# Patient Record
Sex: Male | Born: 1935 | Race: White | Hispanic: No | Marital: Married | Smoking: Former smoker
Health system: Southern US, Community
[De-identification: ages and names within clinical notes are randomized; demographics above are authoritative.]

## PROBLEM LIST (undated history)

## (undated) DIAGNOSIS — J302 Other seasonal allergic rhinitis: Secondary | ICD-10-CM

## (undated) DIAGNOSIS — E785 Hyperlipidemia, unspecified: Secondary | ICD-10-CM

## (undated) DIAGNOSIS — I4891 Unspecified atrial fibrillation: Secondary | ICD-10-CM

## (undated) DIAGNOSIS — K219 Gastro-esophageal reflux disease without esophagitis: Secondary | ICD-10-CM

## (undated) DIAGNOSIS — K5909 Other constipation: Secondary | ICD-10-CM

## (undated) DIAGNOSIS — I428 Other cardiomyopathies: Secondary | ICD-10-CM

## (undated) DIAGNOSIS — I10 Essential (primary) hypertension: Secondary | ICD-10-CM

## (undated) DIAGNOSIS — G4733 Obstructive sleep apnea (adult) (pediatric): Secondary | ICD-10-CM

## (undated) HISTORY — DX: Gastro-esophageal reflux disease without esophagitis: K21.9

## (undated) HISTORY — DX: Other constipation: K59.09

## (undated) HISTORY — DX: Obstructive sleep apnea (adult) (pediatric): G47.33

## (undated) HISTORY — DX: Other cardiomyopathies: I42.8

## (undated) HISTORY — PX: HERNIA REPAIR: SHX51

## (undated) HISTORY — PX: PROSTATE SURGERY: SHX751

## (undated) HISTORY — DX: Essential (primary) hypertension: I10

## (undated) HISTORY — DX: Other seasonal allergic rhinitis: J30.2

## (undated) HISTORY — DX: Hyperlipidemia, unspecified: E78.5

## (undated) HISTORY — DX: Unspecified atrial fibrillation: I48.91

---

## 2008-04-11 ENCOUNTER — Ambulatory Visit (HOSPITAL_COMMUNITY): Admission: RE | Admit: 2008-04-11 | Discharge: 2008-04-11 | Payer: Self-pay | Admitting: Cardiovascular Disease

## 2008-05-18 ENCOUNTER — Ambulatory Visit: Payer: Self-pay | Admitting: Internal Medicine

## 2008-05-25 ENCOUNTER — Ambulatory Visit: Payer: Self-pay | Admitting: Cardiovascular Disease

## 2008-05-25 ENCOUNTER — Inpatient Hospital Stay (HOSPITAL_COMMUNITY): Admission: AD | Admit: 2008-05-25 | Discharge: 2008-05-29 | Payer: Self-pay | Admitting: Cardiovascular Disease

## 2008-05-31 ENCOUNTER — Ambulatory Visit: Payer: Self-pay | Admitting: Internal Medicine

## 2008-06-28 ENCOUNTER — Ambulatory Visit: Payer: Self-pay | Admitting: Internal Medicine

## 2008-06-28 LAB — CONVERTED CEMR LAB
Calcium: 9.2 mg/dL (ref 8.4–10.5)
Digitoxin Lvl: 0.7 ng/mL — ABNORMAL LOW (ref 0.8–2.0)
GFR calc Af Amer: 170 mL/min
GFR calc non Af Amer: 140 mL/min
Magnesium: 1.7 mg/dL (ref 1.5–2.5)
Potassium: 3.9 meq/L (ref 3.5–5.1)
Sodium: 139 meq/L (ref 135–145)

## 2008-07-28 ENCOUNTER — Encounter: Payer: Self-pay | Admitting: Internal Medicine

## 2008-08-23 ENCOUNTER — Telehealth (INDEPENDENT_AMBULATORY_CARE_PROVIDER_SITE_OTHER): Payer: Self-pay | Admitting: *Deleted

## 2008-08-27 ENCOUNTER — Telehealth (INDEPENDENT_AMBULATORY_CARE_PROVIDER_SITE_OTHER): Payer: Self-pay | Admitting: *Deleted

## 2008-09-28 ENCOUNTER — Telehealth: Payer: Self-pay | Admitting: Internal Medicine

## 2008-12-12 ENCOUNTER — Ambulatory Visit: Payer: Self-pay | Admitting: Internal Medicine

## 2008-12-12 DIAGNOSIS — K219 Gastro-esophageal reflux disease without esophagitis: Secondary | ICD-10-CM | POA: Insufficient documentation

## 2008-12-12 DIAGNOSIS — I480 Paroxysmal atrial fibrillation: Secondary | ICD-10-CM

## 2008-12-12 DIAGNOSIS — I10 Essential (primary) hypertension: Secondary | ICD-10-CM

## 2008-12-12 DIAGNOSIS — I5032 Chronic diastolic (congestive) heart failure: Secondary | ICD-10-CM

## 2008-12-12 DIAGNOSIS — E119 Type 2 diabetes mellitus without complications: Secondary | ICD-10-CM

## 2008-12-13 LAB — CONVERTED CEMR LAB
CO2: 28 meq/L (ref 19–32)
Calcium: 10.4 mg/dL (ref 8.4–10.5)
Creatinine, Ser: 0.7 mg/dL (ref 0.4–1.5)
GFR calc non Af Amer: 117.15 mL/min (ref >60)
Magnesium: 2 mg/dL (ref 1.5–2.5)

## 2009-06-25 ENCOUNTER — Ambulatory Visit: Payer: Self-pay | Admitting: Internal Medicine

## 2009-06-25 DIAGNOSIS — R42 Dizziness and giddiness: Secondary | ICD-10-CM | POA: Insufficient documentation

## 2010-01-02 ENCOUNTER — Ambulatory Visit: Payer: Self-pay | Admitting: Internal Medicine

## 2010-01-02 DIAGNOSIS — I440 Atrioventricular block, first degree: Secondary | ICD-10-CM | POA: Insufficient documentation

## 2010-01-28 IMAGING — CR DG CHEST 2V
2 series · 2 of 2 positions shown · non-contrast
Comparison: None

CLINICAL DATA: Shortness of breath.  Precardiac catheterization.

CHEST - 2 VIEW

[view not recorded (1 of 2)]
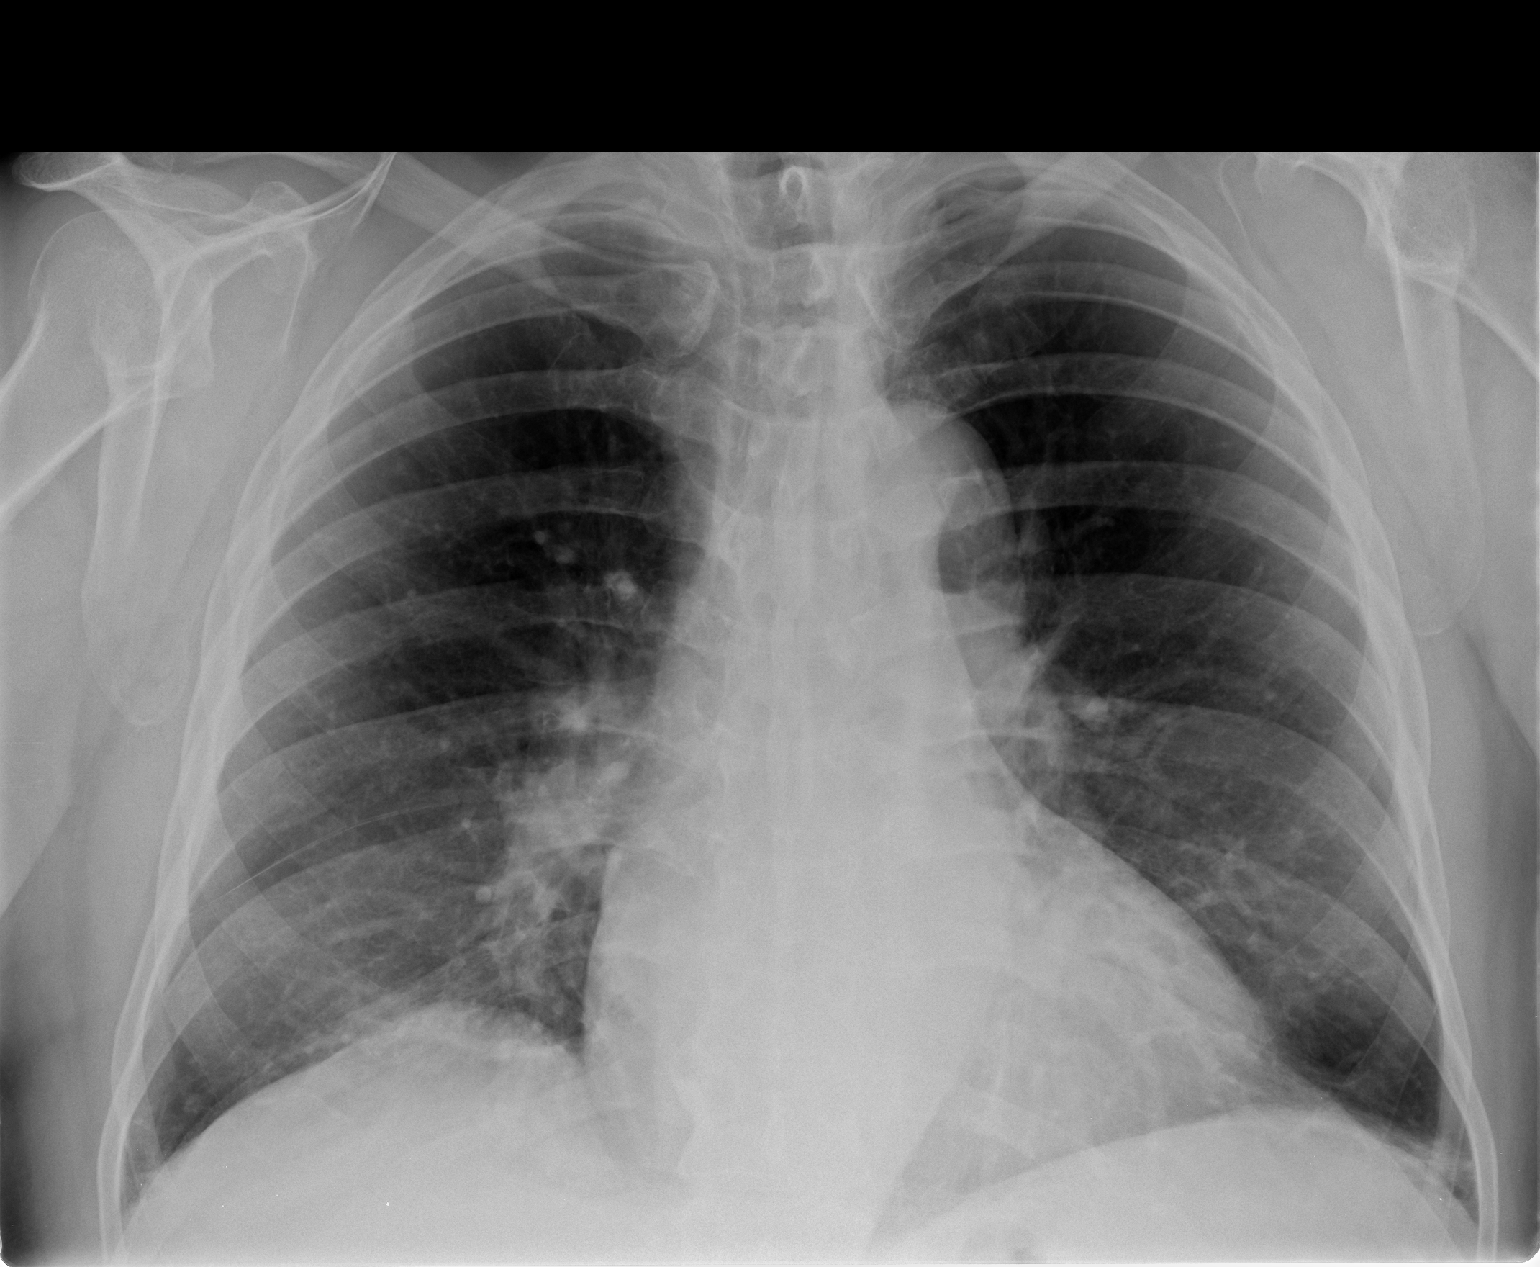

[view not recorded (2 of 2)]
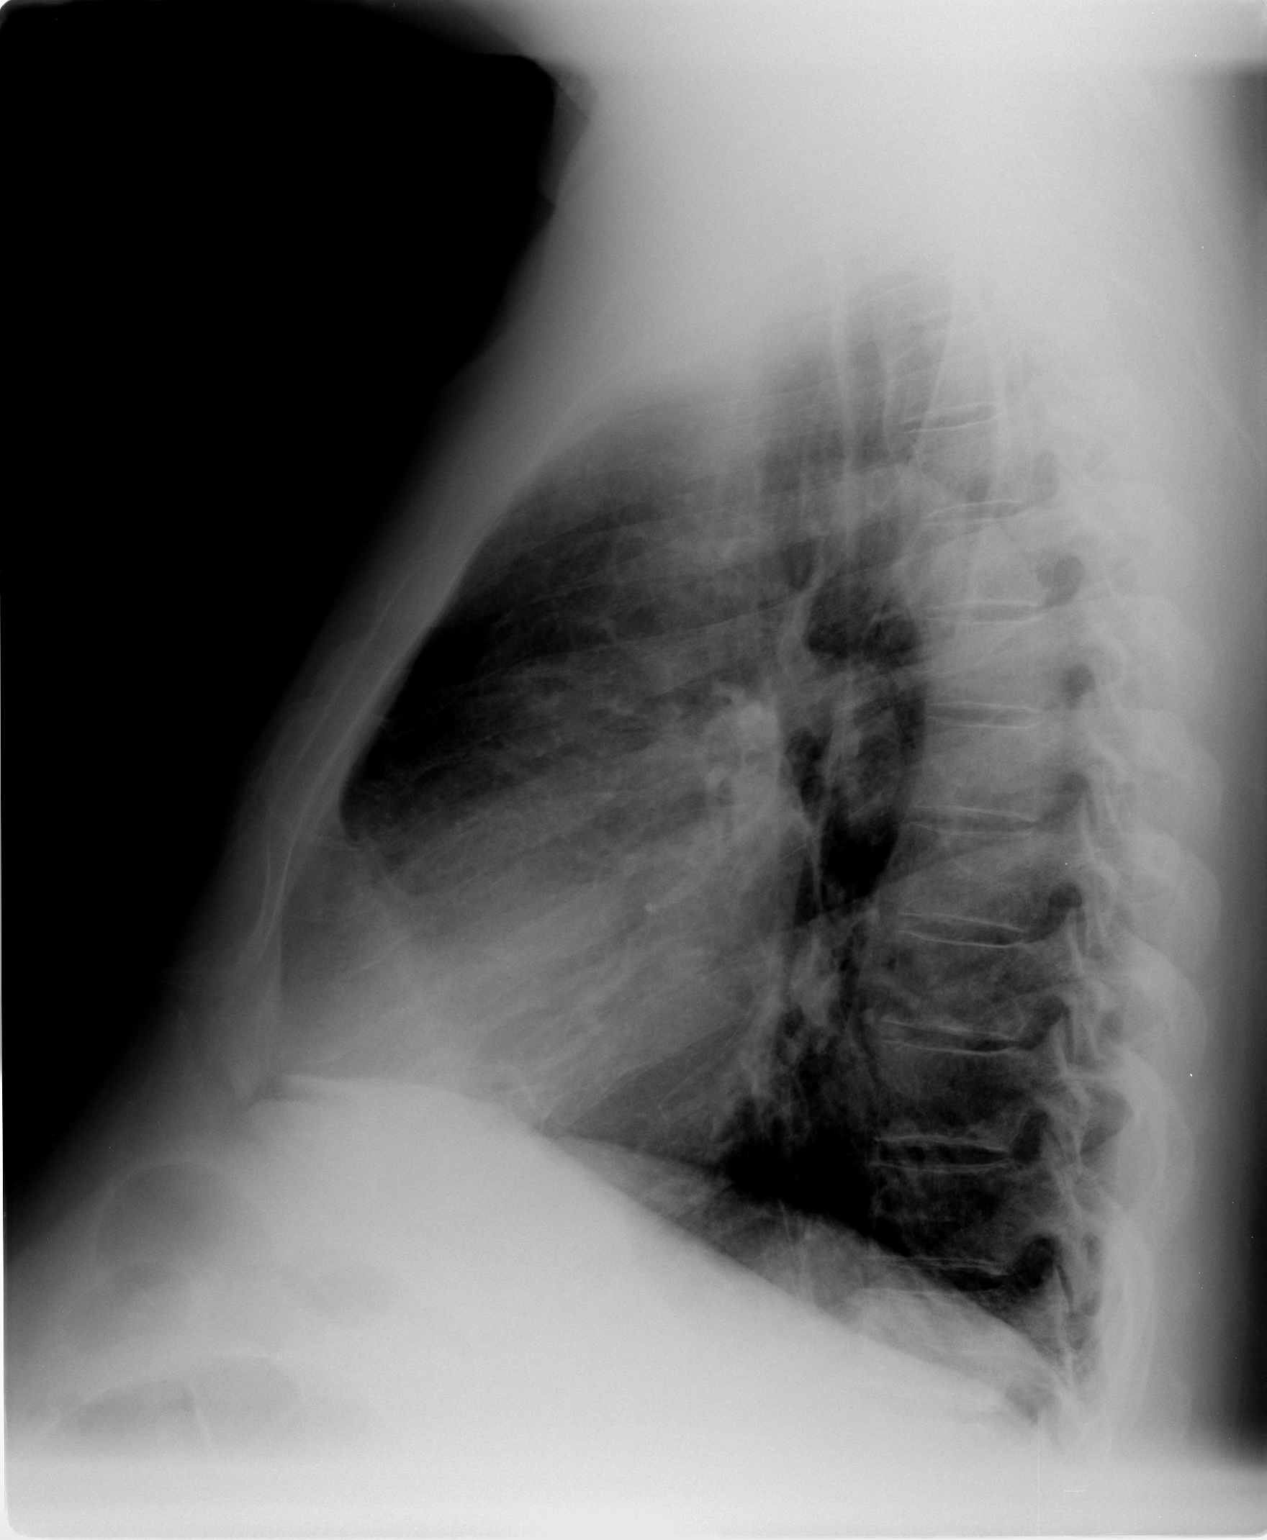

[2 of 2 positions shown; findings below may reference images not displayed]

FINDINGS: There is mild cardiomegaly.  Tortuosity of the thoracic
aorta noted.  Linear densities in the lung bases could represent
bibasilar atelectasis or scarring.  Mild peribronchial thickening.
No effusions.
IMPRESSION: Mild cardiomegaly.

Mild bronchitic changes with bibasilar atelectasis or scarring.

## 2010-02-04 ENCOUNTER — Telehealth: Payer: Self-pay | Admitting: Internal Medicine

## 2010-02-05 ENCOUNTER — Ambulatory Visit: Payer: Self-pay | Admitting: Internal Medicine

## 2010-02-06 LAB — CONVERTED CEMR LAB
CO2: 29 meq/L (ref 19–32)
Calcium: 8.9 mg/dL (ref 8.4–10.5)
Creatinine, Ser: 0.6 mg/dL (ref 0.4–1.5)
GFR calc non Af Amer: 136.89 mL/min (ref >60)
Magnesium: 2 mg/dL (ref 1.5–2.5)

## 2010-06-10 NOTE — Assessment & Plan Note (Signed)
Summary: 6 month rov/sl   Referring Provider:  Arnette Felts PA-C  CC:  6 month rov.  Pt states that the last 3 days he has been feeling fatigued.  He is not sure if he is getting sick or if this is because of something else.[name]  History of Present Illness: Mr. Rayvon Char is seen in followup for atrial fibrillation associated with  congestive heart failure;  he is tolerating Tikosyn well without known recurrences of atrial fibrillation.   he has a history of cardiac dilatation with preserved left ventricular function;   Ultrasound in March of 2010 recent issue of aortic dilatation and CT scanning was recommended Results that were apparently okay.  Blood work was drawn last week it is pending at this time  He has not felt well last couple of days. This is most specifically associated with positional vertigo which has been a recurring problem. There is no other specific symptoms that I can identify.  Current Medications (verified): 1)  Tikosyn 500 Mcg Caps (Dofetilide) .[name].. 1 Cap By Mouth Every 12 Hours 2)  Amlodipine Besylate 5 Mg Tabs (Amlodipine Besylate) .... Take One Tablet Once Daily 3)  Coreg Cr 80 Mg Xr24h-Cap (Carvedilol Phosphate) .... Once Daily 4)  Glimepiride 4 Mg Tabs (Glimepiride) .... Take One Tablet Once Daily 5)  Actoplus Met 15-850 Mg Tabs (Pioglitazone Hcl-Metformin Hcl) .... Once Daily 6)  Mag-Oxide 400 Mg Tabs (Magnesium Oxide) .... Take One Tablet Once Daily 7)  Aspirin 81 Mg Tabs (Aspirin) .... Take One Tablet Once Daily 8)  Klor-Con 10 10 Meq Cr-Tabs (Potassium Chloride) .... Take One Tablet Once Daily 9)  Pradaxa 150 Mg Caps (Dabigatran Etexilate Mesylate) .... Two Times A Day 10)  Actoplus Met 15-850 Mg Tabs (Pioglitazone Hcl-Metformin Hcl) .... Take 2 Tablet At Bedtime 11)  Lipitor 10 Mg Tabs (Atorvastatin Calcium) .... Once Daily 12)  Losartan Potassium 100 Mg Tabs (Losartan Potassium) .... Take One Tablet Once Daily 13)  Novolog 100 Unit/ml Soln (Insulin Aspart) .[name]..  15 Units in The Morning 14)  Furosemide 40 Mg Tabs (Furosemide) .... Take One Tablet Every Other Day  Allergies (verified): 1)  ! * Tetanus 2)  ! * Aldactone  Vital Signs:  Patient profile:   77 year old male Height:      73 inches Weight:      269 pounds BMI:     35.62 Pulse rate:   58 / minute Pulse rhythm:   irregular BP sitting:   140 / 74  (left arm) Cuff size:   large  Vitals Entered By: Judithe Modest CMA (June 25, 2009 10:02 AM)  Physical Exam  General:  The patient was alert and oriented in no acute distress. HEENT Normal. JVP was 7 cm, carotids were brisk.  Lungs were clear.  Heart sounds were regular without murmurs or gallops.  Abdomen was soft with active bowel sounds. There is no clubbing cyanosis or edema. Skin Warm and dry. Neurological examination demonstrated no nystagmus. Vertigo however with reproducible with changes in position and turning of the head    EKG  Procedure date:  06/25/2009  Findings:      sinus rhythm at 58 Intervals 0.22/0.11 5.49 Axis LX  Impression & Recommendations:  Problem # 1:  INTERMITTENT VERTIGO (ICD-780.4) This is a recurring issue. I have suggested that he follow up with his primary care physician to make sure there is no underlying CNS pathology.  Problem # 2:  ATRIAL FIBRILLATION (ICD-427.31) He is tolerating his medicine  well apart from mild symptoms of diarrhea. We await clarification that his potassium and magnesium labs are okay. Electrocardiogram today demonstrated a QT interval of 490 ms. His updated medication list for this problem includes:    Tikosyn 500 Mcg Caps (Dofetilide) .[name]... 1 cap by mouth every 12 hours    Coreg Cr 80 Mg Xr24h-cap (Carvedilol phosphate) ..... Once daily    Aspirin 81 Mg Tabs (Aspirin) .[name]... Take one tablet once daily  Orders: EKG w/ Interpretation (93000)  Problem # 3:  CHRONIC SYSTOLIC HEART FAILURE (ICD-428.22) we await the repeat ultrasound last year his echo  demonstrated ejection fraction of 35%  Problem # 4:  DM (ICD-250.00) diabetes is an issue with introduction of insulin associated with increasing weight over the last 6 months. He is encouraged to continue to work aggressively on exercise His updated medication list for this problem includes:    Glimepiride 4 Mg Tabs (Glimepiride) .[name]... Take one tablet once daily    Actoplus Met 15-850 Mg Tabs (Pioglitazone hcl-metformin hcl) ..... Once daily    Aspirin 81 Mg Tabs (Aspirin) .[name]... Take one tablet once daily    Actoplus Met 15-850 Mg Tabs (Pioglitazone hcl-metformin hcl) .[name]... Take 2 tablet at bedtime    Losartan Potassium 100 Mg Tabs (Losartan potassium) .[name]... Take one tablet once daily    Novolog 100 Unit/ml Soln (Insulin aspart) .Shane KitchenMarland KitchenMarland KitchenMarland Anderson 15 units in the morning  Patient Instructions: 1)  Your physician wants you to follow-up in:  6 MONTHS WITH DR Graciela Husbands.  You will receive a reminder letter in the mail two months in advance. If you don't receive a letter, please call our office to schedule the follow-up appointment.

## 2010-06-10 NOTE — Assessment & Plan Note (Signed)
Summary: F6M/DM   Referring Provider:  Arnette Felts PA-C  CC:  pt has questions regarding Tikosyn.  New cardiologists has questions for patient.  .  History of Present Illness: [name] is seen in followup for atrial fibrillation associated with  congestive heart failure;  he is tolerating Tikosyn well without known recurrences of atrial fibrillation.   he has a history of cardiac dilatation with preserved left ventricular function;  I don't know when his last echo was we have information with a depressed left ventricular function and apparently has had a recent echo with improved LV function takes and labs were obtained at [facility]; potassium level 4.1 magnesium was 1.8 Ultrasound in March of 2010 recent issue of aortic dilatation and CT scanning was recommended Results that were apparently okay.  I also reviewed her rhythm strip from July. It was described as atrial fibrillation. I suspect that this is in error and that in fact it represents PACs with a long PR interval so that the P wave is lost in the preceding T wave. It is associated with some posttermination pausing     Current Medications (verified): 1)  Tikosyn 500 Mcg Caps (Dofetilide) .Marland Kitchen.. 1 Cap By Mouth Every 12 Hours 2)  Amlodipine Besylate 10 Mg Tabs (Amlodipine Besylate) .... Take One Tablet Once Daily 3)  Coreg Cr 80 Mg Xr24h-Cap (Carvedilol Phosphate) .... Once Daily 4)  Glimepiride 4 Mg Tabs (Glimepiride) .... Take One Tablet Once Daily 5)  Magnesium 250 Mg Tabs (Magnesium) .... Two Times A Day Two Tablets 6)  Aspirin 81 Mg Tabs (Aspirin) .... Take One Tablet Once Daily 7)  Klor-Con 10 10 Meq Cr-Tabs (Potassium Chloride) .... Take One Tablet Once Daily 8)  Pradaxa 150 Mg Caps (Dabigatran Etexilate Mesylate) .... Two Times A Day 9)  Actoplus Met 15-850 Mg Tabs (Pioglitazone Hcl-Metformin Hcl) .... Take 2 Tablet At Bedtime and 1 Tablet in The Morning 10)  Lipitor 40 Mg Tabs (Atorvastatin Calcium) .... Once Daily 11)   Losartan Potassium-Hctz 100-12.5 Mg Tabs (Losartan Potassium-Hctz) .... Take One Tablet Once Daily 12)  Novolog 100 Unit/ml Soln (Insulin Aspart) .Marland Kitchen.. 15 Units in The Morning 13)  Furosemide 40 Mg Tabs (Furosemide) .... Take One Tablet Every Day 14)  Lisinopril 10 Mg Tabs (Lisinopril) .... Take One Tablet Once Daily 15)  Vitamin D 1000 Unit Tabs (Cholecalciferol) .... Tablet Three Times A Day  Allergies (verified): 1)  ! * Tetanus 2)  ! 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  Alcohol Use - no Drug Use - no  Vital Signs:  Patient profile:   77 year old male Height:      73 inches Weight:      264 pounds BMI:     34.96 Pulse rhythm:   regular BP sitting:   131 / 73  (left arm) Cuff size:   57  Vitals Entered By: Judithe Modest CMA (January 02, 2010 9:50 AM)  Physical Exam  General:  The patient was alert and oriented in no acute distress. significantly obese HEENT Normal. JVP was 7 cm, carotids were brisk.  Lungs were clear.  Heart sounds were regular without murmurs  or gallops.  Abdomen was soft with active bowel sounds. There is no clubbing cyanosis; 1+ peripheral Skin Warm and dry. Neurological examination demonstrated no nystagmus. Vertigo however with reproducible with changes in position and turning of the head    EKG  Procedure date:  01/02/2010  Findings:      sinus rhythmIntervals 0.26/0.10/0.46 with a QTC of 0.45 axis of 49 poor R-wave progression  Impression & Recommendations:  Problem # 1:  ATRIAL  FIBRILLATION (ICD-427.31)  the patient is holding sinus rhythm on Tikosyn;  his potassium levels are okay ; magnesium level was low but he is now being supplemented. I would continue his Tikosyn.  The rhythm strips are read as atrial fibrillation I suspect her as not as outlined in the history of present illness  he is on the Pradaxa. His CHADS VASC score is 4-5 (1-2-age; chf-1, htn-1, dm-1).    His updated medication list for this problem includes:    Tikosyn 500 Mcg Caps (Dofetilide) .Marland Kitchen... 1 cap by mouth every 12 hours    Coreg Cr 80 Mg Xr24h-cap (Carvedilol phosphate) ..... Once daily    Aspirin 81 Mg Tabs (Aspirin) .Marland Kitchen... Take one tablet once daily  Orders: EKG w/ Interpretation (93000)  Problem # 2:  AV BLOCK, 1ST DEGREE (ICD-426.11) the patient has some worsening of his antegrade conduction. PR Interval a year ago was 240 ms it is now 256 ms. We'll have to keep our eye on this. Interestingly with a PAC it stretched out to almost 300 ms. It may well be that he comes to pacing because of conduction issues. it is possible that Tikosyn is contributing to conduction disease although it shouldn't have been as is a QT prolonging drugs  Problem # 3:  CHRONIC SYSTOLIC HEART FAILURE (ICD-428.22) improvement in functional status. We don't know his most recent ejection fraction.   Patient Instructions: 1)  Your physician recommends that you schedule a follow-up appointment in: YEAR WITH DR Graciela Husbands 2)  Your physician recommends that you continue on your current medications as directed. Please refer to the Current Medication list given to you today.

## 2010-06-10 NOTE — Progress Notes (Signed)
Summary: drug interaction  Phone Note From Pharmacy Call back at [phone]   Caller: Vicky from [facility]  Request: Speak with Nurse, Speak with Provider Summary of Call: pt is on tykosin and hyzaar and they where perscribed by two diff providers and they inetact and [facility] wants to talk about this and they also want the pt to have lab work done ref# [id number] Initial call taken by: Omer Jack,  February 04, 2010 4:21 PM  Follow-up for Phone Call        spoke w/[facility] and problem is the hydrocholothiazide w/ tikosyn. spoke w/vicky [facility] pharmacist and she needs direction from dr. Graciela Husbands as to what to do. will page him for direction.  Follow-up by: Claris Gladden RN,  February 04, 2010 4:47 PM  Additional Follow-up for Phone Call Additional follow up Details #1::        spoke w/ Dr. Graciela Husbands and he adv prescribe Cozaar 100mg  once daily, stop Hyzaar. Have pt come in for BMET and Mag. Adv Vicky/[facility] of new med order and she will fill. Explained to Mrs. Llerena not to have Mr. Kierstead take Hyzaar and that Cozaar has been prescribed.  She will have him come in tomorrow about 1030 for lab work-BMET & Mag. Additional Follow-up by: Claris Gladden RN,  February 04, 2010 5:09 PM    New/Updated Medications: COZAAR 100 MG TABS (LOSARTAN POTASSIUM) once daily

## 2010-08-25 LAB — BASIC METABOLIC PANEL
BUN: 13 mg/dL (ref 6–23)
BUN: 13 mg/dL (ref 6–23)
BUN: 14 mg/dL (ref 6–23)
CO2: 28 mEq/L (ref 19–32)
CO2: 30 mEq/L (ref 19–32)
CO2: 31 mEq/L (ref 19–32)
Calcium: 8.7 mg/dL (ref 8.4–10.5)
Calcium: 9 mg/dL (ref 8.4–10.5)
Calcium: 8.4 mg/dL (ref 8.4–10.5)
Chloride: 103 mEq/L (ref 96–112)
Chloride: 105 mEq/L (ref 96–112)
Chloride: 105 mEq/L (ref 96–112)
Creatinine, Ser: 0.64 mg/dL (ref 0.4–1.5)
Creatinine, Ser: 0.71 mg/dL (ref 0.4–1.5)
GFR calc Af Amer: >60 mL/min (ref >60)
GFR calc Af Amer: >60 mL/min (ref >60)
GFR calc non Af Amer: >60 mL/min (ref >60)
GFR calc non Af Amer: >60 mL/min (ref >60)
GFR calc non Af Amer: >60 mL/min (ref >60)
Glucose, Bld: 162 mg/dL — ABNORMAL HIGH (ref 70–99)
Glucose, Bld: 186 mg/dL — ABNORMAL HIGH (ref 70–99)
Glucose, Bld: 120 mg/dL — ABNORMAL HIGH (ref 70–99)
Glucose, Bld: 135 mg/dL — ABNORMAL HIGH (ref 70–99)
Potassium: 4.1 mEq/L (ref 3.5–5.1)
Sodium: 140 mEq/L (ref 135–145)
Sodium: 141 mEq/L (ref 135–145)
Sodium: 143 mEq/L (ref 135–145)

## 2010-08-25 LAB — GLUCOSE, CAPILLARY
Glucose-Capillary: 126 mg/dL — ABNORMAL HIGH (ref 70–99)
Glucose-Capillary: 140 mg/dL — ABNORMAL HIGH (ref 70–99)
Glucose-Capillary: 140 mg/dL — ABNORMAL HIGH (ref 70–99)

## 2010-08-25 LAB — COMPREHENSIVE METABOLIC PANEL
Albumin: 3.3 g/dL — ABNORMAL LOW (ref 3.5–5.2)
Alkaline Phosphatase: 32 U/L — ABNORMAL LOW (ref 39–117)
BUN: 17 mg/dL (ref 6–23)
Chloride: 102 mEq/L (ref 96–112)
Creatinine, Ser: 0.7 mg/dL (ref 0.4–1.5)
Glucose, Bld: 137 mg/dL — ABNORMAL HIGH (ref 70–99)
Potassium: 3.8 mEq/L (ref 3.5–5.1)
Total Bilirubin: 1.6 mg/dL — ABNORMAL HIGH (ref 0.3–1.2)
Total Protein: 5.8 g/dL — ABNORMAL LOW (ref 6.0–8.3)

## 2010-08-25 LAB — CBC
HCT: 38.2 % — ABNORMAL LOW (ref 39.0–52.0)
Hemoglobin: 13.2 g/dL (ref 13.0–17.0)
Hemoglobin: 12.6 g/dL — ABNORMAL LOW (ref 13.0–17.0)
MCHC: 32.8 g/dL (ref 30.0–36.0)
MCHC: 33.1 g/dL (ref 30.0–36.0)
MCV: 86.6 fL (ref 78.0–100.0)
MCV: 86.8 fL (ref 78.0–100.0)
Platelets: 265 10*3/uL (ref 150–400)
Platelets: 237 10*3/uL (ref 150–400)
RBC: 4.41 MIL/uL (ref 4.22–5.81)
RDW: 14.3 % (ref 11.5–15.5)
RDW: 13.8 % (ref 11.5–15.5)
RDW: 14.4 % (ref 11.5–15.5)

## 2010-08-25 LAB — MAGNESIUM: Magnesium: 2 mg/dL (ref 1.5–2.5)

## 2010-08-25 LAB — PROTIME-INR
INR: 1.9 — ABNORMAL HIGH (ref 0.00–1.49)
INR: 2.3 — ABNORMAL HIGH (ref 0.00–1.49)
INR: 2.4 — ABNORMAL HIGH (ref 0.00–1.49)
Prothrombin Time: 22.8 seconds — ABNORMAL HIGH (ref 11.6–15.2)
Prothrombin Time: 25.1 seconds — ABNORMAL HIGH (ref 11.6–15.2)
Prothrombin Time: 27.7 seconds — ABNORMAL HIGH (ref 11.6–15.2)

## 2010-09-23 NOTE — Letter (Signed)
May 23, 2008    Arnette Felts, PA-C  [facility]  [address]  [place], [place]  [address]   RE:  [name]  MRN:  [id number]  /  DOB:  [birth year]   Dear [name],   It was a pleasure seeing Tracy Kinner last week and I am a little bit  concerned as the dictation has gone awry.   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  He underwent SPECT  scanning that was abnormal with depressed left ventricular function and  defect in the antral apex and underwent catheterization by Dr. Allyson Sabal  without evidence of coronary artery disease.  Ejection fraction was  again estimated in the 20-30% range with diffuse hypokinesia.  Elevated  filling pressures were noted, consistent with his congestive symptoms.  He has dyspnea on exertion, two-pillow orthopnea, nocturnal dyspnea, as  well as some peripheral edema.   He has also been noted to be in atrial fibrillation with a rapid  ventricular response and, interestingly, has had very few symptoms with  this.  His thromboembolic risk factors are notable for hypertension,  age, diabetes, and congestive heart failure with depressed left  ventricular function.   His past medical history is also notable for obstructive sleep apnea.  He also has these episodes of dizziness with a swirling sensation that  does not quite sound vertiginous.   His past medical history, in addition to above, is notable for:   1. Allergies.  2. Gastroesophageal reflux disease.  3. Constipation.  4. Diabetes.   His past cardiac evaluation, in addition to the above, is notable for  carotid Dopplers that were negative.   His past surgical history is notable for hernia x3 and prostate surgery.   SOCIAL HISTORY:  He is married.  He has one child.  He currently drives  a bus.  He does not use cigarettes,  alcohol or recreational drugs.   EXAMINATION:  His blood pressure was 158/90 .  His pulse was 125 and  irregularly irregular.  HEENT:  Unremarkable.  NECK:  His neck veins were elevated.  The carotids were brisk.  BACK:  Without kyphosis or scoliosis.  LUNGS:  Clear.  HEART:  Heart sounds were irregular and rapid with a 2/6 murmur.  ABDOMEN:  Protuberant but soft.  EXTREMITIES:  2+ peripheral edema.  NEUROLOGICAL EXAM:  Grossly normal.  SKIN:  Warm and dry.   Electrocardiogram was notable for atrial fibrillation with a rapid  ventricular response, although the rate was 125 and the intervals were -  /0.11/0.31 with axis of 73 degrees.   IMPRESSION:  1. Congestive heart failure - class III - acute on chronic.  2. Nonischemic cardiomyopathy.  3. Atrial fibrillation with a rapid ventricular response, potentially      contributing to the above.  4. Obstructive sleep apnea.  5. Thromboembolic risk factors notable for:      a.     Hypertension.      b.     Diabetes.      c.     Age.      d.     Congestive heart failure.   [name], as we talked about on the phone, I think the first most important  question is whether his cardiomyopathy is tachycardia-mediated.  To that  end, I think cardioversion with  restoration and maintenance of sinus  rhythm is essential.  To that end, we have discussed the potential use  of Tikosyn and this will depend upon his QT interval following  cardioversion.  We have discussed the potential for arrhythmia risks.  He understands and is willing to proceed that way.   Following that, we would give him some time to see whether his  cardiomyopathy improves.  In the event that it does, then ICD would not  be indicated.  In the event that it does not, consideration should be  given at that point to ICD for primary prevention.   Thanks very much for the consultation.    Sincerely,      Duke Salvia, MD, [facility]  Electronically Signed    SCK/MedQ  DD:  05/23/2008  DT: 05/23/2008  Job #: [id number]

## 2010-09-23 NOTE — Consult Note (Signed)
NAME:  [name], [name]               ACCOUNT NO.:  [id number]   MEDICAL RECORD NO.:  [id number]          PATIENT TYPE:  INP   LOCATION:  3733                         FACILITY:  [facility]   PHYSICIAN:  Noralyn Pick. Eden Emms, MD, FACCDATE OF BIRTH:  04/05/1933   DATE OF CONSULTATION:  05/25/2008  DATE OF DISCHARGE:                                 CONSULTATION   [name] is a 77 year old patient of Dr. Shelva Majestic or Dr. Roxanne Mins  and Dr. Sherryl Manges.  The patient was initially set up to have a TEE-  guided cardioversion, however, his INR yesterday was 1.5 and only 1.9  today.  We went ahead with the transesophageal echo.  The patient needed  to be admitted to the hospital for Tikosyn loading anyway.  We will  tentatively start him on Lovenox after the TEE.  If there is no clot, we  will initiate Tikosyn and have him cardioverted on Monday.   The patient was sedated with 7 mg of Versed and 50 mcg of fentanyl.   Using digital technique, an Omniplane probe was advanced into the distal  esophagus without incident.   Transgastric imaging was somewhat suboptimal.  There appeared to be mild  LVH with global hypokinesis and EF 40-45%.  There was trivial mitral  insufficiency.  There was mild left atrial enlargement.  Right-sided  cardiac chambers were normal.  There was no ASD, aortic valve was  trileaflet and sclerotic, and the mitral valve was mildly thickened with  trivial MR.   The left atrial appendage had no spontaneous contrast and no thrombus.   Imaging of the aorta showed no significant debris.   FINAL IMPRESSION:  1. No spontaneous contrast or left atrial appendage clot.  2. Ejection fraction 40-45%, global hypokinesis, mild left ventricular      hypertrophy.  3. Trivial mitral regurgitation.   The patient will be given Lovenox.  He is being admitted to the hospital  for Tikosyn load.  He will subsequently have cardioversion on Monday  after his Tikosyn load.  He tolerated the  procedure well.      Noralyn Pick. Eden Emms, MD, [facility]  Electronically Signed     PCN/MEDQ  D:  05/25/2008  T:  05/26/2008  Job:  [id number]

## 2010-09-23 NOTE — Cardiovascular Report (Signed)
NAMEKELECHI, ORGERON               ACCOUNT NO.:  [id number]   MEDICAL RECORD NO.:  [id number]          PATIENT TYPE:  OIB   LOCATION:  2899                         FACILITY:  [facility]   PHYSICIAN:  Nanetta Batty, M.D.   DATE OF BIRTH:  1932/12/09   DATE OF PROCEDURE:  04/11/2008  DATE OF DISCHARGE:  04/11/2008                            CARDIAC CATHETERIZATION   [name] is a very pleasant 77 year old mildly overweight married  white male with positive risk factors including family history, non-  insulin requiring diabetes, hypertension, hyperlipidemia, and remote  tobacco abuse, who has had progressive dyspnea on exertion and declining  EF by 2-D echo.  He presents right now at the request of Roxanne Mins,  PA-C and Dr. Harland Dingwall for right and left heart cath to define his  anatomy pressures and physiology.   DESCRIPTION OF PROCEDURE:  The patient was brought to the second floor  [facility] in the postabsorptive state.  He was  premedicated with p.o. Valium and IV Versed and fentanyl.  His right  groin was prepped and shaved in the usual sterile fashion.  Xylocaine 1%  was used for local anesthesia.  A 6-French sheath was inserted into the  right femoral artery using standard Seldinger technique.  A 7-French  sheath was inserted into the right femoral vein.  A 7-French balloon-tip  thermodilution Swan-Ganz catheter was advanced to the right heart  chambers obtaining sequential pressures.  The 6-French right and left  Judkins catheters as well as 6-French pigtail catheter were used for  selective coronary angiography and left ventriculography respectively.  Visipaque dye was used entirety of the case.  Retrograde aortic, left  ventricular, and pullback pressures were recorded.   HEMODYNAMICS:  1. Aortic systolic pressure 157, diastolic pressure 81.  2. Left ventricular systolic pressure 162, end-diastolic pressure 15.  3. RA pressure A-wave 12, V-wave 6, and  mean 7.  4. RV pressure systolic 46, end-diastolic pressure 10.  5. Pulmonary artery pressure systolic 35, end-diastolic 9, mean 24.  6. Pulmonary capillary wedge pressure A-wave 22, V-wave 17, and mean      17.   SELECTIVE CORONARY ANGIOGRAPHY:  1. Left main normal.  2. LAD normal.  3. Left circumflex normal.  4. Ramus intermedius branch was moderate in size and normal.  5. Right coronary artery was large dominant with a shelf-like      ulcerated plaque in the distal RCA with at most 30-40% obstruction.      There was large PDA and PLA branch.   Left ventriculography; RAO left ventriculogram performed using 30 mL of  Visipaque dye at 12 mL per second.  The overall LVEF was estimated  initially approximately 25% with moderate-to-severe global hypokinesia.   IMPRESSION:  Mr. Mcmichen and has a nonischemic cardiomyopathy with  noncritical coronary artery disease, increased filling pressures, and  moderate-to-severe left ventricular dysfunction.  He will need  aggressive medical therapy with the usual medications including  carvedilol, ACE inhibitor, aspirin, statin drug, and potentially a  gastrin inhibitor.  It was also noted during the cath that he had  runs  of paroxysmal atrial fibrillation, which may also need to be addressed  as an outpatient.  A [facility] Telemetry Unit will be  helpful in determining his atrial fibrillation burden and his need for  Coumadin anticoagulation.  I will leave this to Dr. Shelva Majestic and Roxanne Mins, PA-C to decide.  Roxanne Mins, PA-C was notified of these  results.   Sheaths were removed and pressure on the groin to achieve hemostasis.  The patient left the lab in stable condition.  He will be discharged  later today after remaining recumbent for 6 hours.  We will follow up in  [facility].      Nanetta Batty, M.D.  Electronically Signed     JB/MEDQ  D:  04/11/2008  T:  04/11/2008  Job:  [id number]   cc:   Second Floor [facility]  [facility]  Macarthur Critchley. Shelva Majestic, M.D.  Roxanne Mins, PA-C

## 2010-09-23 NOTE — Letter (Signed)
June 28, 2008    Roxanne Mins, PA-C  [facility]  [address]  [place], [place] [address]   RE:  MARKEVIOUS, EHMKE  MRN:  [id number]  /  DOB:  02/07/1933   Dear [name],   Mr. Lannen comes in today in followup for atrial fibrillation, started  on Tikosyn, which he is tolerating without recurrent known tachy  palpitations.  Intercurrent event recording demonstrated some irregular  atrial rhythms of some mild bradycardia and some PVCs.  Overall, his  wife will think he is doing much better with less shortness of breath.   MEDICATION:  1. Lipitor 10.  2. Actoplus.  3. Glimepiride 4.  4. Coreg 80.  5. Benicar 40.  6. Norvasc 5.  7. Magnesium.  8. Spironolactone.  9. Aspirin 81.  10.Tikosyn 500 mcg q.12 h.  11.Digoxin 0.25.  12.He is also on Coumadin.   PHYSICAL EXAMINATION:  VITAL SIGNS:  His blood pressure is quite  elevated at 178/78.  His pulse was 53.  His weight was 266, which  represents error in weight as we have him is 20 pounds lighter than he  was 2 month ago.  LUNGS:  Clear.  HEART:  Heart sounds were regular today with some irregularity.  ABDOMEN:  Soft.  EXTREMITIES:  1-2+ edema.   IMPRESSION:  1. Atrial fibrillation, status post cardioversion on Tikosyn holding      sinus rhythm.  2. Cardiomyopathy, presumed tachycardia-mediated with some      intercurrent improvement in left ventricular function.  3. Congestive heart failure - chronic - diastolic manifested by      peripheral edema and some shortness of breath.  4. Hypertension - systolic.   Mr. Dontell, Mian, is doing better.  I would like to continue him on his  Tikosyn.  We will check his potassium and magnesium today as  surveillance labs.   As relates to his edema, I would like to put him on Lasix and I wrote a  prescription for 40 mg of Lasix to take every other day for 2 weeks.  We  will check his BMET today and make a decision as to whether his  potassium   prescription, which we have given him should be filled.   I have reviewed his Coreg for his cardiomyopathy at his request.  I hope  that the ideation of the diuretic and natriuresis will help his blood  pressure pretty may well be that he may need further blood pressure  management.  I have also suggest that about 2 months' time that we will  recheck an echo to see if there is any intercurrent progression in LV  systolic healing.    Sincerely,      Duke Salvia, MD, [facility]  Electronically Signed    SCK/MedQ  DD: 06/28/2008  DT: 06/28/2008  Job #: [id number]

## 2010-09-23 NOTE — Discharge Summary (Signed)
NAMEREYN, FAIVRE               ACCOUNT NO.:  [id number]   MEDICAL RECORD NO.:  [id number]          PATIENT TYPE:  INP   LOCATION:  3737                         FACILITY:  [facility]   PHYSICIAN:  Duke Salvia, MD, FACCDATE OF BIRTH:  20-Apr-1933   DATE OF ADMISSION:  05/25/2008  DATE OF DISCHARGE:  05/29/2008                               DISCHARGE SUMMARY   PRIMARY CARDIOLOGIST:  Macarthur Critchley. Shelva Majestic, M.D.   ELECTROPHYSIOLOGIST:  Duke Salvia, MD, [facility]   DISCHARGE DIAGNOSIS:  Atrial fibrillation.   SECONDARY DIAGNOSES:  1. Nonischemic cardiomyopathy with an EF of 20-30%.  2. Hypertension.  3. Hyperlipidemia.  4. Diabetes mellitus.  5. Obstructive sleep apnea.  6. GERD.  7. Seasonal allergies.  8. Chronic constipation.  9. Chronic systolic congestive heart failure.   ALLERGIES:  TETANUS, TOXOID.   PROCEDURE:  Transesophageal echocardiogram performed May 25, 2008  showing EF 40-50%, global hypokinesis, mild LVH, trivial MR and no  evidence of left atrial appendage clot.   HISTORY OF PRESENT ILLNESS:  This is a 77 year old male recently  evaluated by Dr. Graciela Husbands in clinic on May 23, 2008, secondary to newly  diagnosed atrial fibrillation with rapid circular response.  It was felt  that the patient would likely benefit from transesophageal  echocardiogram followed by cardioversion and potential Tikosyn loading.  Arrangements were subsequently made for TEE and cardioversion.   HOSPITAL COURSE:  The patient presented for transesophageal  echocardiogram on January 15.  It was noted that his INR was only 1.5 on  January 14 and 1.9, January 15.  Therefore, cardioversion could not be  performed.  Transesophageal echocardiogram was performed; however, and  this revealed no evidence of left atrial appendage clot.  The patient  was subsequently admitted for initiation of Tikosyn therapy.   Unfortunately, following admission, Mr. Rijos was hypokalemia,  initially with a  potassium of 3.1.  Following oral supplementation, we  were finally able to get him up to 4.1 by the evening of January 16.  His magnesium was also supplemented as this was 1.8 on admission.  Once  his magnesium and potassium were adequately supplemented, we were able  to initiate Tikosyn therapy on 500 mcg q.12 h.  The patient has  tolerated Tikosyn therapy without any significant prolongation of his  QTC.  We will plan to discharge him home on the 500 mcg q.12 h dose.   During this admission, we initiated spironolactone therapy in the  setting of hypokalemia and chronic systolic heart failure.  As above,  his potassium has been stable right around the 4.0 range.  In the  setting of ongoing hypertension, we also initiated Norvasc therapy which  the patient has tolerated.  Because the patient was previously on  Benicar HCTZ and HCTZ is contraindicated in conjunction with Tikosyn, we  have discontinued HCTZ.  We have continued Benicar 40 mg daily.   DISCHARGE LABORATORIES:  Hemoglobin 12.6, hematocrit 38.2, WBC 5.2,  platelets 238, INR 3.1.  Sodium 143, potassium 3.2, chloride 105, CO2  30, BUN 14, creatinine 0.60, glucose 135, total bilirubin 1.6, alkaline  phosphatase  32, AST 32, ALT 33, total protein 5.8, albumin 3.3, calcium  9.0, magnesium 2.0.   DISPOSITION:  The patient will be discharged home today in good  condition.   FOLLOWUP:  We will arrange for the patient to follow-up or to obtain an  event recorder from our office.  He will also subsequently follow-up  with Dr. Graciela Husbands on February 8 at 1:30 p.m..  We have asked him to follow-  up with Dr. Shelva Majestic in approximately one week.   DISCHARGE MEDICATIONS:  1. Coumadin 5 mg half a tablet tonight, then one tablet daily as      previously taken.  2. Aspirin 81 mg daily.  3. Lipitor 10 mg daily.  4. Amaryl 4 mg daily.  5. Actoplus met 850 mg one tablet in the a.m., 2 tablets in the p.m.  6. Coreg CR 80 mg daily.  7. Mag-Ox 400  daily.  8. Tikosyn 500 mcg q.12 h.  9. Benicar 40 mg daily.  10.Norvasc 5 mg daily.  11.Spirolactone 25 mg daily.   LABORATORY DATA:  Outstanding lab studies:  None.   DURATION DISCHARGE ENCOUNTER:  Forty eight minutes including physician  time.      Nicolasa Ducking, ANP      Duke Salvia, MD, [facility]  Electronically Signed    CB/MEDQ  D:  05/29/2008  T:  05/29/2008  Job:  [id number]   cc:   Macarthur Critchley. Shelva Majestic, M.D.

## 2010-10-09 ENCOUNTER — Telehealth: Payer: Self-pay | Admitting: Internal Medicine

## 2010-10-13 ENCOUNTER — Telehealth: Payer: Self-pay | Admitting: Internal Medicine

## 2010-10-13 DIAGNOSIS — I4891 Unspecified atrial fibrillation: Secondary | ICD-10-CM

## 2010-10-13 NOTE — Telephone Encounter (Signed)
I called and spoke with the pharmacist at [facility]- most recent creatinine was 0.6 in 9/11 wt was 264 in 8/11. Per [name]- pharmacist, the pt is ok for his current dose of Tikosyn bid.

## 2010-10-13 NOTE — Telephone Encounter (Signed)
See 10/13/10 phone note.

## 2010-10-13 NOTE — Telephone Encounter (Signed)
Has question re tykosyn dosage needs lab values to make sure the dosage is still correct

## 2010-10-13 NOTE — Telephone Encounter (Signed)
Pt needs tykosin refill called into [facility] asap

## 2010-10-14 ENCOUNTER — Other Ambulatory Visit: Payer: Self-pay | Admitting: *Deleted

## 2010-10-14 DIAGNOSIS — I4891 Unspecified atrial fibrillation: Secondary | ICD-10-CM

## 2010-10-14 MED ORDER — DOFETILIDE 500 MCG PO CAPS: 500.0000 ug | ORAL_CAPSULE | Freq: Two times a day (BID) | ORAL | Status: DC

## 2010-10-16 ENCOUNTER — Encounter: Payer: Self-pay | Admitting: Internal Medicine

## 2010-12-10 ENCOUNTER — Ambulatory Visit: Payer: Self-pay | Admitting: Internal Medicine

## 2011-01-27 ENCOUNTER — Other Ambulatory Visit: Payer: Self-pay | Admitting: Internal Medicine

## 2011-02-03 ENCOUNTER — Ambulatory Visit (INDEPENDENT_AMBULATORY_CARE_PROVIDER_SITE_OTHER): Payer: Medicare Other | Admitting: Internal Medicine

## 2011-02-03 ENCOUNTER — Encounter: Payer: Self-pay | Admitting: Internal Medicine

## 2011-02-03 DIAGNOSIS — I509 Heart failure, unspecified: Secondary | ICD-10-CM

## 2011-02-03 DIAGNOSIS — I4891 Unspecified atrial fibrillation: Secondary | ICD-10-CM

## 2011-02-03 DIAGNOSIS — I5032 Chronic diastolic (congestive) heart failure: Secondary | ICD-10-CM

## 2011-02-03 DIAGNOSIS — I10 Essential (primary) hypertension: Secondary | ICD-10-CM

## 2011-02-03 LAB — BASIC METABOLIC PANEL
CO2: 29 mEq/L (ref 19–32)
Calcium: 9.6 mg/dL (ref 8.4–10.5)
Chloride: 107 mEq/L (ref 96–112)
Glucose, Bld: 103 mg/dL — ABNORMAL HIGH (ref 70–99)
Potassium: 4.6 mEq/L (ref 3.5–5.1)
Sodium: 143 mEq/L (ref 135–145)

## 2011-02-03 MED ORDER — LOSARTAN POTASSIUM 100 MG PO TABS: 100.0000 mg | ORAL_TABLET | Freq: Every day | ORAL | Status: DC

## 2011-02-03 NOTE — Progress Notes (Signed)
HPI  [name] is a 77 y.o. male is seen in followup for atrial fibrillation associated with congestive heart failure; he is tolerating Tikosyn well without known recurrences of atrial fibrillation.   He is tolerating his Tikosyn well.  The patient denies SOB, chest pain edema or palpitations.  There has been no syncope or presyncope.  He has a history of cardiac dilatation with preserved left ventricular function; I don't know when his last echo was we have information with a depressed left ventricular function and apparently has had a recent echo with improved LV function  Ultrasound in March of 2010 recent issue of aortic dilatation and CT scanning was recommended Results that were apparently okay.      Past Medical History  Diagnosis Date  . Atrial fibrillation   . Nonischemic cardiomyopathy     EF 20-30%  . HTN (hypertension)   . Hyperlipidemia   . Diabetes mellitus   . OSA (obstructive sleep apnea)   . GERD (gastroesophageal reflux disease)   . Seasonal allergies   . Chronic constipation   . Chronic systolic congestive heart failure     Past Surgical History  Procedure Date  . Hernia repair     x3  . Prostate surgery     Current Outpatient Prescriptions  Medication Sig Dispense Refill  . amLODipine (NORVASC) 10 MG tablet Take 10 mg by mouth daily.        Marland Kitchen aspirin 81 MG tablet Take 81 mg by mouth daily.        Marland Kitchen atorvastatin (LIPITOR) 40 MG tablet Take 40 mg by mouth daily.        . Cholecalciferol (VITAMIN D) 1000 UNITS capsule Take 1,000 Units by mouth 3 (three) times daily.        Marland Kitchen COREG CR 80 MG 24 hr capsule TAKE 1 CAPSULE DAILY  90 capsule  1  . dabigatran (PRADAXA) 150 MG CAPS Take 150 mg by mouth 2 (two) times daily.        Marland Kitchen dofetilide (TIKOSYN) 500 MCG capsule Take 1 capsule (500 mcg total) by mouth 2 (two) times daily.  180 capsule  3  . furosemide (LASIX) 40 MG tablet Take 40 mg by mouth daily.        Marland Kitchen glimepiride (AMARYL) 4 MG tablet Take 4 mg  by mouth daily before breakfast.        . insulin aspart (NOVOLOG) 100 UNIT/ML injection Inject 15 Units into the skin every morning.        Marland Kitchen KLOR-CON 10 10 MEQ CR tablet TAKE 1 TABLET DAILY  90 tablet  4  . lisinopril (PRINIVIL,ZESTRIL) 10 MG tablet Take 10 mg by mouth daily.        Marland Kitchen losartan-hydrochlorothiazide (HYZAAR) 100-12.5 MG per tablet Take 1 tablet by mouth daily.        . Magnesium 250 MG TABS Take 2 tablets by mouth 2 (two) times daily.        . pioglitazone-metformin (ACTOPLUS MET) 15-850 MG per tablet Take 2 tablets at bedtime and 1 tablet in the morning         Allergies  Allergen Reactions  . Spironolactone     REACTION: gynecomastia  . Tetanus Toxoid     Review of Systems negative except from HPI and PMH  Physical Exam Well developed and obese le in no acute distress HENT normal E scleral and icterus clear Neck Supple JVP flat; carotids brisk and full Clear to ausculation Regular rate and  rhythm, no murmurs gallops or rub Soft with active bowel sounds No clubbing cyanosis and edema Alert and oriented, grossly normal motor and sensory function Skin Warm and Dry  ECG sinus rhythm at 58 with an occasional PAC Intervals 0.27/0.11/0.5 to with a QTC of 0.51 Axis is 94 Assessment and  Plan

## 2011-02-03 NOTE — Assessment & Plan Note (Signed)
He is relatively stable on his current medications. See below

## 2011-02-03 NOTE — Assessment & Plan Note (Addendum)
He seems to be maintaining sinus rhythm on Tikosyn. Review of the electro- cardiogram however demonstrates a QTC of 510 msand such we have to look at potential contributors to QT prolongation. We will check his potassium and magnesium levels. Also he is on Hyzaar which will need to be changed to Cozaar to eliminate hydrochlorothiazide moiety which is contraindicated with Tikosyn. In the event that the above are all normal, we will recheck his electrocardiogram in 2 weeks. If the QTC remains long, we will reduce his tinnitus and dose to 375 twice daily. We will also discontinue his aspirin as it likely significantly augments risk of Pradaxa without likely improvement in efficacy. Her breath in addition I have given the family the website QT drugs.org against which all drugs should be checked.

## 2011-02-03 NOTE — Assessment & Plan Note (Signed)
Reasonably controlled 

## 2011-02-03 NOTE — Patient Instructions (Addendum)
Your physician has recommended you make the following change in your medication:  1) Stop aspirin. 2) Stop losartan/hctz. 3) Start Cozaar 100mg  once daily.  Your physician recommends that you have lab work today: bmp/magnesium (427.31).  You will need to come back in 2 weeks to have an EKG with the nurse.  Your physician wants you to follow-up in: 6 months with Dr. Graciela Husbands. You will receive a reminder letter in the mail two months in advance. If you don't receive a letter, please call our office to schedule the follow-up appointment.

## 2011-02-13 LAB — GLUCOSE, CAPILLARY: Glucose-Capillary: 153 mg/dL — ABNORMAL HIGH (ref 70–99)

## 2011-02-19 ENCOUNTER — Encounter: Payer: Self-pay | Admitting: Internal Medicine

## 2011-04-01 ENCOUNTER — Telehealth: Payer: Self-pay | Admitting: Internal Medicine

## 2011-04-01 NOTE — Telephone Encounter (Signed)
All Cardiac faxed to [facility] @  [phone]  04/01/11/km

## 2011-04-04 ENCOUNTER — Other Ambulatory Visit: Payer: Self-pay | Admitting: Internal Medicine

## 2011-04-06 ENCOUNTER — Other Ambulatory Visit: Payer: Self-pay | Admitting: Internal Medicine

## 2011-04-21 ENCOUNTER — Telehealth: Payer: Self-pay | Admitting: Internal Medicine

## 2011-04-21 NOTE — Telephone Encounter (Signed)
The patient Pradaxa can be stopped. It should be stopped probably 48 hours prior to the procedure. The procedure list should note that Pradaxa will repeat therapeutic effect within a few hours following Reinitiation and that time to be determined based on the procedure and whether or not any biopsy is required.

## 2011-04-21 NOTE — Telephone Encounter (Addendum)
Pt having an EEG, to go off pradaxa 5-7 days prior needs ok  and how long after surgery should he stay off?

## 2011-04-21 NOTE — Telephone Encounter (Signed)
Fu call Pt is also calling about this issue please let him know

## 2011-04-21 NOTE — Telephone Encounter (Signed)
The patient is on Pradaxa for a-fib. I do not see a stroke history. Call is from GI dept in [facility]- pending EGD. Will review with Dr. Graciela Husbands and notify the office and patient. They are requesting when patient can restart Pradaxa- should be per performing MD.

## 2011-04-21 NOTE — Telephone Encounter (Signed)
I spoke with the patient and made him aware of Dr. Odessa Fleming recommendations. I advised I would call the GI office in [facility] tomorrow and relay this to them. He voices understanding.

## 2011-04-22 ENCOUNTER — Encounter: Payer: Self-pay | Admitting: *Deleted

## 2011-04-22 NOTE — Telephone Encounter (Signed)
I attempted to contact Felicia at [facility]. She is in a procedure right now. I explained I would fax a note to them regarding the patient's Pradaxa. The fax # is [phone].

## 2011-07-03 ENCOUNTER — Other Ambulatory Visit: Payer: Self-pay | Admitting: Internal Medicine

## 2011-08-20 ENCOUNTER — Encounter: Payer: Self-pay | Admitting: Internal Medicine

## 2011-08-20 ENCOUNTER — Ambulatory Visit (INDEPENDENT_AMBULATORY_CARE_PROVIDER_SITE_OTHER): Payer: Medicare Other | Admitting: Internal Medicine

## 2011-08-20 VITALS — BP 129/73 | HR 78 | Ht 73.0 in | Wt 271.4 lb

## 2011-08-20 DIAGNOSIS — I10 Essential (primary) hypertension: Secondary | ICD-10-CM

## 2011-08-20 DIAGNOSIS — I4891 Unspecified atrial fibrillation: Secondary | ICD-10-CM

## 2011-08-20 DIAGNOSIS — I440 Atrioventricular block, first degree: Secondary | ICD-10-CM

## 2011-08-20 NOTE — Assessment & Plan Note (Signed)
Stable

## 2011-08-20 NOTE — Patient Instructions (Signed)
Your physician wants you to follow-up in: 6 months with Dr. Klein. You will receive a reminder letter in the mail two months in advance. If you don't receive a letter, please call our office to schedule the follow-up appointment.  Your physician recommends that you continue on your current medications as directed. Please refer to the Current Medication list given to you today.  

## 2011-08-20 NOTE — Progress Notes (Signed)
HPI  [name] is a 78 y.o. male seen in followup for atrial fibrillation associated with congestive heart failure; he is tolerating Tikosyn well without known recurrences of atrial fibrillation.   He is tolerating his Tikosyn well.  The patient denies SOB, chest pain edema or palpitations.  There has been no syncope or presyncope. He is exercising 7 days a week  At his last vist his QTc was a little long, but hyzar was discontinued and it s better    Past Medical History  Diagnosis Date  . Atrial fibrillation     on tikosyn  . Nonischemic cardiomyopathy     EF 20-30% apparently significantly improved  . HTN (hypertension)   . Hyperlipidemia   . Diabetes mellitus   . OSA (obstructive sleep apnea)     CPAP  . GERD (gastroesophageal reflux disease)   . Seasonal allergies   . Chronic constipation   . Chronic diastolic heart failure     Past Surgical History  Procedure Date  . Hernia repair     x3  . Prostate surgery     Current Outpatient Prescriptions  Medication Sig Dispense Refill  . amLODipine (NORVASC) 10 MG tablet Take 10 mg by mouth daily.        Marland Kitchen atorvastatin (LIPITOR) 40 MG tablet Take 40 mg by mouth daily.        . Cholecalciferol (VITAMIN D) 1000 UNITS capsule Take 1,000 Units by mouth 3 (three) times daily.        Marland Kitchen COREG CR 80 MG 24 hr capsule TAKE 1 CAPSULE DAILY  90 capsule  0  . dabigatran (PRADAXA) 150 MG CAPS Take 150 mg by mouth 2 (two) times daily.        . furosemide (LASIX) 40 MG tablet Take 40 mg by mouth daily.        Marland Kitchen glimepiride (AMARYL) 4 MG tablet Take 4 mg by mouth daily before breakfast.        . KLOR-CON 10 10 MEQ CR tablet TAKE 1 TABLET DAILY  90 tablet  4  . LEVEMIR FLEXPEN 100 UNIT/ML injection Inject into the skin 2 (two) times daily. 60am/48pm      . lisinopril (PRINIVIL,ZESTRIL) 10 MG tablet Take 10 mg by mouth daily.        Marland Kitchen losartan (COZAAR) 100 MG tablet Take 1 tablet (100 mg total) by mouth daily.  90 tablet  3  .  Magnesium 250 MG TABS Take 2 tablets by mouth 2 (two) times daily.        . metFORMIN (GLUCOPHAGE) 1000 MG tablet 1,000 mg 2 (two) times daily with a meal.       . pioglitazone-metformin (ACTOPLUS MET) 15-850 MG per tablet Take 2 tablets at bedtime and 1 tablet in the morning       . TIKOSYN 500 MCG capsule TAKE 1 CAPSULE EVERY 12 HOURS  180 capsule  3    Allergies  Allergen Reactions  . Spironolactone     REACTION: gynecomastia  . Tetanus Toxoid     Review of Systems negative except from HPI and PMH  Physical Exam BP 129/73  Pulse 78  Ht 6\' 1"  (1.854 m)  Wt 271 lb 6.4 oz (123.106 kg)  BMI 35.81 kg/m2 Well developed and well nourished in no acute distress HENT normal E scleral and icterus clear Neck Supple JVP flat; carotids brisk and full Clear to ausculation Regular rate and rhythm, no murmurs gallops or rub Soft with active bowel  sounds No clubbing cyanosis none Edema Alert and oriented, grossly normal motor and sensory function Skin Warm and Dry  Electrocardiogram demonstrates sinus rhythm at 67 Intervals 0.29/11/46 QTC 490  Assessment and  Plan

## 2011-08-20 NOTE — Assessment & Plan Note (Signed)
Stable on current meds 

## 2011-08-20 NOTE — Assessment & Plan Note (Addendum)
Stable on tikosyn with blocked PACs  Labs being followed by mike duran and QTC ok

## 2011-10-01 ENCOUNTER — Other Ambulatory Visit: Payer: Self-pay | Admitting: Internal Medicine

## 2012-02-11 ENCOUNTER — Telehealth: Payer: Self-pay | Admitting: *Deleted

## 2012-02-11 NOTE — Telephone Encounter (Signed)
Malissa Hippo a pharmacist with [facility], called in regards to TYKOSYN 500 MCG capsules ( take 1 tablet bid).  The pharmacist needed the last hight and weight on the pt due to pt's age and such a high dose.  Informed the Pharmacist on his last appointment on 08-20-2011 the pt's H: 73" and Wt: 271 lbs, the pharmacist stated it was ok to refill the TYKOSYN 500 MCG capsules ( take 1 tablet bid).  Caralee Ates, CMA

## 2012-02-15 ENCOUNTER — Other Ambulatory Visit: Payer: Self-pay | Admitting: Internal Medicine

## 2012-02-27 ENCOUNTER — Other Ambulatory Visit: Payer: Self-pay | Admitting: Internal Medicine

## 2012-03-01 ENCOUNTER — Other Ambulatory Visit: Payer: Self-pay | Admitting: Cardiovascular Disease

## 2012-03-04 ENCOUNTER — Ambulatory Visit (INDEPENDENT_AMBULATORY_CARE_PROVIDER_SITE_OTHER): Payer: Medicare Other | Admitting: Internal Medicine

## 2012-03-04 VITALS — BP 145/81 | HR 66 | Ht 73.0 in | Wt 271.0 lb

## 2012-03-04 DIAGNOSIS — I5032 Chronic diastolic (congestive) heart failure: Secondary | ICD-10-CM

## 2012-03-04 DIAGNOSIS — I4891 Unspecified atrial fibrillation: Secondary | ICD-10-CM

## 2012-03-04 DIAGNOSIS — I10 Essential (primary) hypertension: Secondary | ICD-10-CM

## 2012-03-04 DIAGNOSIS — R9430 Abnormal result of cardiovascular function study, unspecified: Secondary | ICD-10-CM | POA: Insufficient documentation

## 2012-03-04 DIAGNOSIS — R9439 Abnormal result of other cardiovascular function study: Secondary | ICD-10-CM

## 2012-03-04 NOTE — Progress Notes (Signed)
Patient has no care team.   HPI  [name] is a 78 y.o. male .seen in followup for atrial fibrillation associated with congestive heart failure; he is tolerating Tikosyn well without known recurrences of atrial fibrillation.   Marland Kitchen     He underwent echocardiography and stress has been at [facility]. Echo showed LV function at 70%. Stress test showed ejection fraction 38% with inferior inferolateral inferobasilar ischemia. He is scheduled with a catheterization with Dr. Allyson Sabal just a few weeks. His test were done as part of routine surveillance. He has no complaints of worsening exercise tolerance or chest pain.  r    Past Medical History  Diagnosis Date  . Campath-induced atrial fibrillation     on tikosyn  . Nonischemic cardiomyopathy     EF 20-30% apparently significantly improved  . HTN (hypertension)   . Hyperlipidemia   . Diabetes mellitus   . OSA (obstructive sleep apnea)     CPAP  . GERD (gastroesophageal reflux disease)   . Seasonal allergies   . Chronic constipation   . Chronic diastolic heart failure     Past Surgical History  Procedure Date  . Hernia repair     x3  . Prostate surgery     Current Outpatient Prescriptions  Medication Sig Dispense Refill  . amLODipine (NORVASC) 10 MG tablet Take 10 mg by mouth daily.        Marland Kitchen atorvastatin (LIPITOR) 40 MG tablet Take 40 mg by mouth daily.        . Cholecalciferol (VITAMIN D) 1000 UNITS capsule Take 1,000 Units by mouth 3 (three) times daily.        Marland Kitchen COREG CR 80 MG 24 hr capsule TAKE 1 CAPSULE DAILY  90 capsule  2  . dabigatran (PRADAXA) 150 MG CAPS Take 150 mg by mouth 2 (two) times daily.        . furosemide (LASIX) 40 MG tablet Take 40 mg by mouth daily.        Marland Kitchen glimepiride (AMARYL) 4 MG tablet Take 4 mg by mouth daily before breakfast.        . KLOR-CON 10 10 MEQ tablet TAKE 1 TABLET DAILY  90 tablet  3  . LEVEMIR FLEXPEN 100 UNIT/ML injection Inject into the skin 2 (two) times daily. 80am/70pm      .  lisinopril (PRINIVIL,ZESTRIL) 10 MG tablet Take 10 mg by mouth daily.        . Magnesium 250 MG TABS Take 2 tablets by mouth 2 (two) times daily.        . metFORMIN (GLUCOPHAGE) 1000 MG tablet 1,000 mg 2 (two) times daily with a meal.       . TIKOSYN 500 MCG capsule TAKE 1 CAPSULE EVERY 12 HOURS  180 capsule  2    Allergies  Allergen Reactions  . Spironolactone     REACTION: gynecomastia  . Tetanus Toxoid     Review of Systems negative except from HPI and PMH  Physical Exam BP 145/81  Pulse 66  Ht 6\' 1"  (1.854 m)  Wt 271 lb (122.925 kg)  BMI 35.75 kg/m2 Well developed and well nourished in no acute distress HENT normal E scleral and icterus clear Neck Supple JVP flat; carotids brisk and full Clear to ausculation  *Regular rate and rhythm, +s4  Soft with active bowel sounds No clubbing cyanosis 1+ Edema Alert and oriented, grossly normal motor and sensory function Skin Warm and Dry  Electrocardiogram demonstrates sinus rhythm at 66 Intervals  24/11/46 with a QTC of 485. As an isolated PVC Assessment and  Plan

## 2012-03-04 NOTE — Assessment & Plan Note (Signed)
He asked that we review the outpatient studies. There is discordance ejection fraction. This might be related to find habitus. Catheterization will be clarifying. In addition, there are significant perfusion abnormalities which need clarification also. Catheterization I think is appropriate

## 2012-03-04 NOTE — Assessment & Plan Note (Signed)
Patient remains on dofetilide. He will have preoperative laboratories does part of his catheterization. He will need to be reviewed for potassium and magnesium. He continues on Pradaxa which he is tolerating.

## 2012-03-04 NOTE — Assessment & Plan Note (Signed)
Stable on current medications. He does have significant edema. I wonder whether this is related to amlodipine. I've asked him to discuss with Arnette Felts with her alternative antihypertensive regime would be appropriate

## 2012-03-04 NOTE — Assessment & Plan Note (Signed)
As above.

## 2012-03-04 NOTE — Patient Instructions (Addendum)
Your physician wants you to follow-up in: 6 MONTHS WITH DR KLEIN You will receive a reminder letter in the mail two months in advance. If you don't receive a letter, please call our office to schedule the follow-up appointment.  

## 2012-03-11 ENCOUNTER — Encounter (HOSPITAL_COMMUNITY): Payer: Self-pay | Admitting: Pharmacy Technician

## 2012-03-24 ENCOUNTER — Encounter (HOSPITAL_COMMUNITY): Payer: Self-pay | Admitting: Cardiology

## 2012-03-24 ENCOUNTER — Encounter (HOSPITAL_COMMUNITY)
Admission: RE | Disposition: A | Discharge status: Home / Self Care | Payer: Self-pay | Source: Ambulatory Visit | Attending: Cardiovascular Disease

## 2012-03-24 ENCOUNTER — Ambulatory Visit (HOSPITAL_COMMUNITY)
Admission: RE | Admit: 2012-03-24 | Discharge: 2012-03-24 | Disposition: A | Discharge status: Home / Self Care | Payer: Medicare Other | Source: Ambulatory Visit | Attending: Cardiovascular Disease | Admitting: Cardiovascular Disease

## 2012-03-24 DIAGNOSIS — I4891 Unspecified atrial fibrillation: Secondary | ICD-10-CM | POA: Insufficient documentation

## 2012-03-24 DIAGNOSIS — G4730 Sleep apnea, unspecified: Secondary | ICD-10-CM | POA: Diagnosis present

## 2012-03-24 DIAGNOSIS — E785 Hyperlipidemia, unspecified: Secondary | ICD-10-CM | POA: Diagnosis present

## 2012-03-24 DIAGNOSIS — I10 Essential (primary) hypertension: Secondary | ICD-10-CM | POA: Diagnosis present

## 2012-03-24 DIAGNOSIS — I2510 Atherosclerotic heart disease of native coronary artery without angina pectoris: Secondary | ICD-10-CM | POA: Diagnosis present

## 2012-03-24 DIAGNOSIS — G4733 Obstructive sleep apnea (adult) (pediatric): Secondary | ICD-10-CM | POA: Insufficient documentation

## 2012-03-24 DIAGNOSIS — I480 Paroxysmal atrial fibrillation: Secondary | ICD-10-CM | POA: Diagnosis present

## 2012-03-24 DIAGNOSIS — Z7901 Long term (current) use of anticoagulants: Secondary | ICD-10-CM

## 2012-03-24 DIAGNOSIS — E119 Type 2 diabetes mellitus without complications: Secondary | ICD-10-CM | POA: Diagnosis present

## 2012-03-24 DIAGNOSIS — K219 Gastro-esophageal reflux disease without esophagitis: Secondary | ICD-10-CM | POA: Insufficient documentation

## 2012-03-24 DIAGNOSIS — R9430 Abnormal result of cardiovascular function study, unspecified: Secondary | ICD-10-CM | POA: Diagnosis present

## 2012-03-24 DIAGNOSIS — R9439 Abnormal result of other cardiovascular function study: Secondary | ICD-10-CM | POA: Insufficient documentation

## 2012-03-24 DIAGNOSIS — I428 Other cardiomyopathies: Secondary | ICD-10-CM | POA: Diagnosis present

## 2012-03-24 HISTORY — PX: LEFT HEART CATHETERIZATION WITH CORONARY ANGIOGRAM: SHX5451

## 2012-03-24 LAB — BASIC METABOLIC PANEL
BUN: 14 mg/dL (ref 6–23)
CO2: 26 mEq/L (ref 19–32)
Chloride: 104 mEq/L (ref 96–112)
GFR calc Af Amer: >90 mL/min (ref >90)
Glucose, Bld: 186 mg/dL — ABNORMAL HIGH (ref 70–99)
Potassium: 3.8 mEq/L (ref 3.5–5.1)

## 2012-03-24 LAB — CBC
HCT: 38.9 % — ABNORMAL LOW (ref 39.0–52.0)
Hemoglobin: 13.2 g/dL (ref 13.0–17.0)
MCH: 27.8 pg (ref 26.0–34.0)
MCHC: 33.9 g/dL (ref 30.0–36.0)

## 2012-03-24 LAB — PROTIME-INR: INR: 1.1 (ref 0.00–1.49)

## 2012-03-24 LAB — GLUCOSE, CAPILLARY: Glucose-Capillary: 140 mg/dL — ABNORMAL HIGH (ref 70–99)

## 2012-03-24 SURGERY — LEFT HEART CATHETERIZATION WITH CORONARY ANGIOGRAM
Anesthesia: LOCAL

## 2012-03-24 MED ORDER — DIAZEPAM 5 MG PO TABS
5.0000 mg | ORAL_TABLET | ORAL | Status: AC
  Administered 2012-03-24: 5 mg via ORAL
  Filled 2012-03-24: qty 1

## 2012-03-24 MED ORDER — SODIUM CHLORIDE 0.9 % IV SOLN: INTRAVENOUS | Status: DC

## 2012-03-24 MED ORDER — ONDANSETRON HCL 4 MG/2ML IJ SOLN: 4.0000 mg | Freq: Four times a day (QID) | INTRAMUSCULAR | Status: DC | PRN

## 2012-03-24 MED ORDER — SODIUM CHLORIDE 0.9 % IV SOLN
INTRAVENOUS | Status: DC
  Administered 2012-03-24: 09:00:00 via INTRAVENOUS

## 2012-03-24 MED ORDER — NITROGLYCERIN 0.2 MG/ML ON CALL CATH LAB
INTRAVENOUS | Status: AC
  Filled 2012-03-24: qty 1

## 2012-03-24 MED ORDER — HYDRALAZINE HCL 20 MG/ML IJ SOLN: 10.0000 mg | Freq: Four times a day (QID) | INTRAMUSCULAR | Status: DC | PRN

## 2012-03-24 MED ORDER — SODIUM CHLORIDE 0.9 % IJ SOLN: 3.0000 mL | INTRAMUSCULAR | Status: DC | PRN

## 2012-03-24 MED ORDER — LISINOPRIL 20 MG PO TABS: 20.0000 mg | ORAL_TABLET | Freq: Every day | ORAL | Status: DC

## 2012-03-24 MED ORDER — ACETAMINOPHEN 325 MG PO TABS: 650.0000 mg | ORAL_TABLET | ORAL | Status: DC | PRN

## 2012-03-24 MED ORDER — HEPARIN (PORCINE) IN NACL 2-0.9 UNIT/ML-% IJ SOLN
INTRAMUSCULAR | Status: AC
  Filled 2012-03-24: qty 1000

## 2012-03-24 MED ORDER — MIDAZOLAM HCL 2 MG/2ML IJ SOLN
INTRAMUSCULAR | Status: AC
  Filled 2012-03-24: qty 2

## 2012-03-24 MED ORDER — MORPHINE SULFATE 4 MG/ML IJ SOLN: 2.0000 mg | INTRAMUSCULAR | Status: DC | PRN

## 2012-03-24 MED ORDER — FENTANYL CITRATE 0.05 MG/ML IJ SOLN
INTRAMUSCULAR | Status: AC
  Filled 2012-03-24: qty 2

## 2012-03-24 MED ORDER — LIDOCAINE HCL (PF) 1 % IJ SOLN
INTRAMUSCULAR | Status: AC
  Filled 2012-03-24: qty 30

## 2012-03-24 MED ORDER — HYDRALAZINE HCL 20 MG/ML IJ SOLN: 10.0000 mg | INTRAMUSCULAR | Status: DC

## 2012-03-24 NOTE — H&P (Signed)
Patient ID: [name] MRN: [id number], DOB/AGE: November 29, 1932   Admit date: 03/24/2012   Primary Physician: No primary provider on file. Primary Cardiologist: M. Duran PA  HPI: 78 y/o with a history of NICM. He was cathed in 2009 by Dr Allyson Sabal and had minor CAD with 30-40% RCA. His EF has apparently improved. He has PAF and has been followed by Dr Graciela Husbands since 2010. He is in NSR on [place] and Pradaxa (last Pradaxa was Sunday prior to admission). In August 2013 he had a "screening" Myoview and a follow up echocardiogram. Dr Graciela Husbands noted a discrepancy between the echo EF and Myoview EF, 38% by Myoview and 65-70% by echo. The pt has no anginal symptoms. There was some ischemia noted on Myoview as well. These results were reviewed by Dr Graciela Husbands and Judie Petit. Duran and the pt is referred now for diagnostic cath. His last dose of Pradaxa was Sunday prior to admission, (3 days ago).   Problem List: Past Medical History  Diagnosis Date  . Atrial fibrillation     on tikosyn  . Nonischemic cardiomyopathy     EF 20-30% apparently significantly improved  . HTN (hypertension)   . Hyperlipidemia   . Diabetes mellitus   . OSA (obstructive sleep apnea)     CPAP  . GERD (gastroesophageal reflux disease)   . Seasonal allergies   . Chronic constipation     Past Surgical History  Procedure Date  . Hernia repair     x3  . Prostate surgery      Allergies:  Allergies  Allergen Reactions  . Spironolactone Other (See Comments)    REACTION: gynecomastia  . Tetanus Toxoid Other (See Comments)    Reaction unknown     Home Medications Prescriptions prior to admission  Medication Sig Dispense Refill  . amLODipine (NORVASC) 10 MG tablet Take 10 mg by mouth daily.        Marland Kitchen atorvastatin (LIPITOR) 40 MG tablet Take 40 mg by mouth daily.        . carvedilol (COREG CR) 80 MG 24 hr capsule Take 80 mg by mouth daily.      . Cholecalciferol (VITAMIN D) 1000 UNITS capsule Take 1,000 Units by mouth 3 (three) times  daily.        . dabigatran (PRADAXA) 150 MG CAPS Take 150 mg by mouth 2 (two) times daily.        Marland Kitchen dofetilide (TIKOSYN) 500 MCG capsule Take 500 mcg by mouth 2 (two) times daily.      . furosemide (LASIX) 40 MG tablet Take 40 mg by mouth daily.        Marland Kitchen glimepiride (AMARYL) 4 MG tablet Take 4 mg by mouth daily before breakfast.        . LEVEMIR FLEXPEN 100 UNIT/ML injection Inject 70-80 Units into the skin 2 (two) times daily. 80am/70pm      . lisinopril (PRINIVIL,ZESTRIL) 10 MG tablet Take 10 mg by mouth daily.        . Magnesium 250 MG TABS Take 2 tablets by mouth 2 (two) times daily.       . metFORMIN (GLUCOPHAGE) 1000 MG tablet Take 1,000 mg by mouth 2 (two) times daily with a meal.       . potassium chloride (K-DUR,KLOR-CON) 10 MEQ tablet Take 10 mEq by mouth daily.         No family history on file.   History   Social History  . Marital Status: Married    Spouse  Name: N/A    Number of Children: 1  . Years of Education: N/A   Occupational History  . Full time bus driver    Social History Main Topics  . Smoking status: Former Smoker    Types: Cigarettes    Quit date: 03/24/2012  . Smokeless tobacco: Former Neurosurgeon    Quit date: 03/25/1983  . Alcohol Use: No  . Drug Use: No  . Sexually Active: Not on file   Other Topics Concern  . Not on file   Social History Narrative  . No narrative on file     Review of Systems: General: negative for chills, fever, night sweats or weight changes.  Cardiovascular: negative for chest pain, dyspnea on exertion, edema, orthopnea, palpitations, paroxysmal nocturnal dyspnea or shortness of breath Dermatological: negative for rash Respiratory: negative for cough or wheezing Urologic: negative for hematuria Abdominal: negative for nausea, vomiting, diarrhea, bright red blood per rectum, melena, or hematemesis Neurologic: negative for visual changes, syncope, or dizziness. Past history of migraine All other systems reviewed and are  otherwise negative except as noted above.  Physical Exam: Blood pressure 197/96, pulse 78, temperature 96.8 F (36 C), temperature source Oral, resp. rate 18, height 6\' 1"  (1.854 m), weight 120.203 kg (265 lb), SpO2 92.00%.  General appearance: alert, cooperative, no distress and moderately obese Neck: no carotid bruit, no JVD and supple, symmetrical, trachea midline Lungs: clear to auscultation bilaterally Heart: regular rate and rhythm Abdomen: Obese, non tender Extremities: extremities normal, atraumatic, no cyanosis or edema Pulses: 2+ and symmetric Skin: Skin color, texture, turgor normal. No rashes or lesions Neurologic: Grossly normal    Labs:   Results for orders placed during the hospital encounter of 03/24/12 (from the past 24 hour(s))  GLUCOSE, CAPILLARY     Status: Abnormal   Collection Time   03/24/12  8:25 AM      Component Value Range   Glucose-Capillary 185 (*) 70 - 99 mg/dL     Radiology/Studies: No results found.  [id number]  ASSESSMENT AND PLAN:  Principal Problem:  *Cardiovascular function study, abnormal, EF 38% Aug 2013. (Echo EF 65-70%) Active Problems:  HTN (hypertension), LVH on echo  PAF, NSR on Tykosyn, followed by Dr Graciela Husbands  Non-ischemic cardiomyopathy, past EF 20-30% to 65-70%  CAD, minor CAD at cath 2009. 30-40% RCA  Chronic anticoagulation, on Pradaxa prior to admission  DM  Dyslipidemia  Sleep apnea, on C-pap  Plan- Cath today. He is hypertensive and his wife notes Amlodipine recently stopped secondary to lower extremity edema. The pt has had gynecomastia with Aldactone in the past. Dr Graciela Husbands notes some prolongation of QTc with Hyzaar in the past. Ms Delpozo has asked [place] to address his HTN as the pt will not follow up with his new cardiologist for a month or so. I would his Lisinopril to 20mg  but I have no renal function information.  Will Rx now with Hydralazine for HTN prior to cath and consider adjusting his medication post  cath.  Deland Pretty, PA-C 03/24/2012, 8:59 AM  Agree with note written by Corine Shelter [facility]  For cath to adjudicate EF as well as define coronary anatomy secondary to abnormal Hassell Halim 03/24/2012 10:38 AM

## 2012-03-24 NOTE — H&P (Signed)
    Pt was reexamined and existing H & P reviewed. No changes found.  Runell Gess, MD [facility] 03/24/2012 10:39 AM

## 2012-03-24 NOTE — Op Note (Signed)
[name] is a 78 y.o. male    [id number] LOCATION:  FACILITY: [facility]  PHYSICIAN: Nanetta Batty, M.D. 1932/07/21   DATE OF PROCEDURE:  03/24/2012  DATE OF DISCHARGE:  [facility]  CARDIAC CATHETERIZATION     History obtained from chart review.78 y/o with a history of NICM. He was cathed in 2009 by Dr Allyson Sabal and had minor CAD with 30-40% RCA. His EF has apparently improved. He has PAF and has been followed by Dr Graciela Husbands since 2010. He is in NSR on [place] and Pradaxa (last Pradaxa was Sunday prior to admission). In August 2013 he had a "screening" Myoview and a follow up echocardiogram. Dr Graciela Husbands noted a discrepancy between the echo EF and Myoview EF, 38% by Myoview and 65-70% by echo. The pt has no anginal symptoms. There was some ischemia noted on Myoview as well. These results were reviewed by Dr Graciela Husbands and Judie Petit. Duran and the pt is referred now for diagnostic cath. His last dose of Pradaxa was Sunday prior to admission, (3 days ago).       PROCEDURE DESCRIPTION:    The patient was brought to the second floor  [facility] cath lab in the postabsorptive state. He was  premedicated with 5 mg of Valium by mouth, IV Versed and fentanyl.Marland Kitchen His right groinwas prepped and shaved in usual sterile fashion. Xylocaine 1% was used for local anesthesia. A 5 French sheath was inserted into the common femoral artery using standard Seldinger technique. 5 French right and left Judkins diagnostic catheters multiple 5 French pigtail catheter were used for selective coronary angiography, and left ventriculography respectively. Visipaque dye was used for the entirety of the case. Retrograde aortic, left ventricular and pullback pressures were recorded.  HEMODYNAMICS:    AO SYSTOLIC/AO DIASTOLIC: 180/68   LV SYSTOLIC/LV DIASTOLIC: 186/25  ANGIOGRAPHIC RESULTS:   1. Left main; normal  2. LAD; essentially normal the small first diagonal branch had a 50% proximal stenosis  unchanged from prior cath 3. Left circumflex; normal  Along with a large ramus branch was normal as well.  4. Right coronary artery; large dominant vessel with a 30% distal stenosis and what appears to be a ruptured plaque with a "shelf" that was unchanged from prior cath in 2009 5. Left ventriculography; RAO left ventriculogram was performed using  25 mL of Visipaque dye at 12 mL/second. The overall LVEF estimated  50 % Without wall motion abnormalities. The patient was in atrial fibrillation making accurate quantitation of his EF problematic with an erratic rate.  IMPRESSION:Mr. Bergthold . Has unchanged anatomy with minimal CAD and preserved left jugular function. I believe the stress test was false positive and the discordance in his ejection fraction by echo and Myoview probably explainable by his arrhythmia. His sheath was removed and a "MYNX  "closure device was used to obtain hemostasis with excellent result. The patient left the Cath Lab in stable condition. He'll be gently hydrated, discharged on approximately 2 hours. Might Duran PA-C has been notified of these results.  Runell Gess MD, [facility] 03/24/2012 11:18 AM

## 2012-05-05 ENCOUNTER — Other Ambulatory Visit (HOSPITAL_COMMUNITY): Payer: Self-pay | Admitting: Cardiovascular Disease

## 2012-05-05 ENCOUNTER — Ambulatory Visit (HOSPITAL_COMMUNITY)
Admission: RE | Admit: 2012-05-05 | Discharge: 2012-05-05 | Disposition: A | Discharge status: Home / Self Care | Payer: Medicare Other | Source: Ambulatory Visit | Attending: Cardiovascular Disease | Admitting: Cardiovascular Disease

## 2012-05-05 DIAGNOSIS — I729 Aneurysm of unspecified site: Secondary | ICD-10-CM

## 2012-05-05 NOTE — Progress Notes (Signed)
Right groin check completed. No evidence of pseudoaneurysm. Chauncy Lean

## 2012-05-13 ENCOUNTER — Telehealth: Payer: Self-pay | Admitting: Internal Medicine

## 2012-05-13 NOTE — Telephone Encounter (Signed)
Reviewed meds with pt and confirmed Tikosyn dose.

## 2012-05-13 NOTE — Telephone Encounter (Signed)
New Problem:    Patient's wife called in wanting to know how to proceed because the patient's insurance statedt that they no longer carried dofetilide (TIKOSYN) 500 MCG capsule.  Please call back.

## 2012-06-24 ENCOUNTER — Other Ambulatory Visit: Payer: Self-pay | Admitting: Internal Medicine

## 2012-06-28 MED ORDER — CARVEDILOL PHOSPHATE ER 80 MG PO CP24: 80.0000 mg | ORAL_CAPSULE | Freq: Every day | ORAL | Status: DC

## 2012-06-28 NOTE — Addendum Note (Signed)
Addended by: Einar Nolasco, [place] N on: 06/28/2012 08:57 AM   Modules accepted: Orders

## 2012-08-09 ENCOUNTER — Other Ambulatory Visit: Payer: Self-pay | Admitting: *Deleted

## 2012-08-09 MED ORDER — DOFETILIDE 500 MCG PO CAPS: 500.0000 ug | ORAL_CAPSULE | Freq: Two times a day (BID) | ORAL | Status: DC

## 2012-08-12 ENCOUNTER — Telehealth: Payer: Self-pay | Admitting: Internal Medicine

## 2012-08-12 NOTE — Telephone Encounter (Signed)
Spoke with pt wife, samples of tikosyn placed at the front desk for pt to pick up. [facility] called, the tikosyn refill has been processed, they can not see if it has been shipped yet. Pt wife made aware.

## 2012-08-12 NOTE — Telephone Encounter (Signed)
Follow up     Pt following up on previous message about prior auth for Tikosyn 500mg . Pt need short supply b/c it takes a 1week for mail order. Please call pt concerning this matter.

## 2012-08-12 NOTE — Telephone Encounter (Deleted)
error 

## 2012-08-15 ENCOUNTER — Telehealth: Payer: Self-pay | Admitting: Internal Medicine

## 2012-08-15 NOTE — Telephone Encounter (Signed)
New Prob    Wanted to notify Dr. Graciela Husbands authorization for Joice Lofts is on the way via fax from [facility]. Would like nurse to call her when fax is sent back to [facility].

## 2012-08-15 NOTE — Telephone Encounter (Signed)
Noted. Will forward to Deliah Goody also in case she gets the fax from Dr. Odessa Fleming box.

## 2012-08-16 NOTE — Telephone Encounter (Signed)
Follow-up:    Patient's wife called in wanting a call back to follow-up with you about her husband's medication.  Please call back.

## 2012-08-16 NOTE — Telephone Encounter (Signed)
Spoke with [facility], script for [employer] given. They will expedite the order due to the pt almost out of med. Pt wife aware.

## 2012-08-17 ENCOUNTER — Other Ambulatory Visit: Payer: Self-pay | Admitting: *Deleted

## 2012-09-01 ENCOUNTER — Ambulatory Visit (INDEPENDENT_AMBULATORY_CARE_PROVIDER_SITE_OTHER): Payer: Medicare Other | Admitting: Internal Medicine

## 2012-09-01 ENCOUNTER — Encounter: Payer: Self-pay | Admitting: Internal Medicine

## 2012-09-01 VITALS — BP 158/76 | HR 71 | Ht 73.0 in | Wt 274.0 lb

## 2012-09-01 DIAGNOSIS — I2510 Atherosclerotic heart disease of native coronary artery without angina pectoris: Secondary | ICD-10-CM

## 2012-09-01 DIAGNOSIS — I10 Essential (primary) hypertension: Secondary | ICD-10-CM

## 2012-09-01 DIAGNOSIS — I4891 Unspecified atrial fibrillation: Secondary | ICD-10-CM

## 2012-09-01 DIAGNOSIS — I480 Paroxysmal atrial fibrillation: Secondary | ICD-10-CM

## 2012-09-01 MED ORDER — LISINOPRIL 20 MG PO TABS: 20.0000 mg | ORAL_TABLET | Freq: Every day | ORAL | Status: DC

## 2012-09-01 NOTE — Assessment & Plan Note (Signed)
Poorly controlled blood pressure since the discontinuation of his amlodipine. I will increase his lisinopril from 10-20. Repeat metabolic profile in 2 weeks

## 2012-09-01 NOTE — Patient Instructions (Signed)
Your physician has recommended you make the following change in your medication:  1) Increase lisinopril to 20 mg one tablet by mouth once daily.  Your physician wants you to follow-up in: 6 months with Dr. Graciela Husbands. You will receive a reminder letter in the mail two months in advance. If you don't receive a letter, please call our office to schedule the follow-up appointment.

## 2012-09-01 NOTE — Progress Notes (Signed)
Patient Care Team: Coralee Rud, PA-C as PCP - General (Cardiology)   HPI  [name] is a 79 y.o. male .seen in followup for atrial fibrillation associated with congestive heart failure; he is tolerating Tikosyn well without known recurrences of atrial fibrillation.   Marland Kitchen     He underwent echocardiography and stress has been at [facility]. Echo showed LV function at 70%. Stress test showed ejection fraction 38% with inferior inferolateral inferobasilar ischemia.  For clarification given the discordant information he underwent catheterization 11/13 demonstrating no obstructive disease with an ejection fraction of 50%   His blood pressure medications were changed with the discontinuation of amlodipine because of edema. This was quite successful but his blood pressure has been elevated. There has been some dizziness and some facial rubor  r    Past Medical History  Diagnosis Date  . Atrial fibrillation     on tikosyn  . Nonischemic cardiomyopathy     EF 20-30% apparently significantly improved  . HTN (hypertension)   . Hyperlipidemia   . Diabetes mellitus   . OSA (obstructive sleep apnea)     CPAP  . GERD (gastroesophageal reflux disease)   . Seasonal allergies   . Chronic constipation     Past Surgical History  Procedure Laterality Date  . Hernia repair      x3  . Prostate surgery      Current Outpatient Prescriptions  Medication Sig Dispense Refill  . atorvastatin (LIPITOR) 40 MG tablet Take 40 mg by mouth daily.        . carvedilol (COREG CR) 80 MG 24 hr capsule Take 1 capsule (80 mg total) by mouth daily.  90 capsule  3  . Cholecalciferol (VITAMIN D) 1000 UNITS capsule Take 1,000 Units by mouth 3 (three) times daily.        . dabigatran (PRADAXA) 150 MG CAPS Take 150 mg by mouth 2 (two) times daily.        Marland Kitchen dofetilide (TIKOSYN) 500 MCG capsule Take 1 capsule (500 mcg total) by mouth 2 (two) times daily.  180 capsule  1  . furosemide (LASIX) 40 MG tablet Take 40 mg  by mouth daily.        Marland Kitchen glimepiride (AMARYL) 4 MG tablet Take 4 mg by mouth 2 (two) times daily.       Marland Kitchen LEVEMIR FLEXPEN 100 UNIT/ML injection Inject 70-80 Units into the skin 2 (two) times daily. 80am/70pm      . lisinopril (PRINIVIL,ZESTRIL) 10 MG tablet Take 10 mg by mouth daily.        . Magnesium 500 MG CAPS Take 2 capsules by mouth 2 (two) times daily.      . metFORMIN (GLUCOPHAGE) 1000 MG tablet Take 1,000 mg by mouth 2 (two) times daily with a meal.       . potassium chloride (K-DUR,KLOR-CON) 10 MEQ tablet Take 10 mEq by mouth daily.       No current facility-administered medications for this visit.    Allergies  Allergen Reactions  . Hyzaar (Losartan Potassium-Hctz)     Prolonged QTc  . Amlodipine     Edema  . Spironolactone Other (See Comments)    REACTION: gynecomastia  . Tetanus Toxoid Other (See Comments)    Reaction unknown    Review of Systems negative except from HPI and PMH  Physical Exam BP 145/101  Pulse 43  Ht 6\' 1"  (1.854 m)  Wt 274 lb (124.286 kg)  BMI 36.16 kg/m2  SpO2 94%  Well developed and well nourished in no acute distress HENT normal E scleral and icterus clear Neck Supple JVP flat; carotids brisk and full Clear to ausculation  *Regular rate and rhythm, +s4  Soft with active bowel sounds No clubbing cyanosis 1+ Edema Alert and oriented, grossly normal motor and sensory function Skin Warm and Dry   ECG demonstrates sinus rhythm at 63 with occasional PACs intervals 29/10/46 Axis is 68

## 2012-09-01 NOTE — Assessment & Plan Note (Signed)
Patient is stable on dofetilide. QT interval is quite good. Last potassium in our records was 3.8. We have blood work checked in 2 weeks by primary cardiology. I have asked him to make sure that his potassium levels are both 4 and magnesium levels 2. We will review this again in 6 months

## 2012-09-02 ENCOUNTER — Telehealth: Payer: Self-pay | Admitting: Internal Medicine

## 2012-09-02 DIAGNOSIS — I10 Essential (primary) hypertension: Secondary | ICD-10-CM

## 2012-09-02 DIAGNOSIS — I2510 Atherosclerotic heart disease of native coronary artery without angina pectoris: Secondary | ICD-10-CM

## 2012-09-02 DIAGNOSIS — I480 Paroxysmal atrial fibrillation: Secondary | ICD-10-CM

## 2012-09-02 MED ORDER — LISINOPRIL 40 MG PO TABS: 40.0000 mg | ORAL_TABLET | Freq: Every day | ORAL | Status: AC

## 2012-09-02 NOTE — Telephone Encounter (Signed)
Follow up    Seen in the office on yesterday    Has questions regarding dosage .

## 2012-09-02 NOTE — Telephone Encounter (Signed)
Spoke with pt wife, per dr Graciela Husbands the pt will increase lisinopril to 40 mg once daily. New script sent to the pharm.

## 2012-09-02 NOTE — Telephone Encounter (Signed)
New Problem:    Patient's wife called in because her husband's lisinopril (PRINIVIL,ZESTRIL) 20 MG tablet had already been increased to 20mg  prior to his appointment yesterday and she forgot to mention that to Dr. Graciela Husbands.  Would like to know if her husband should increase it more because his BP was still high on that dose.  Please call back.

## 2013-03-14 ENCOUNTER — Ambulatory Visit: Payer: Medicare Other | Admitting: Internal Medicine

## 2013-04-20 ENCOUNTER — Ambulatory Visit (INDEPENDENT_AMBULATORY_CARE_PROVIDER_SITE_OTHER): Payer: Medicare Other | Admitting: Internal Medicine

## 2013-04-20 ENCOUNTER — Encounter: Payer: Self-pay | Admitting: Internal Medicine

## 2013-04-20 VITALS — BP 158/88 | HR 59 | Ht 73.0 in | Wt 272.8 lb

## 2013-04-20 DIAGNOSIS — I480 Paroxysmal atrial fibrillation: Secondary | ICD-10-CM

## 2013-04-20 DIAGNOSIS — I4891 Unspecified atrial fibrillation: Secondary | ICD-10-CM

## 2013-04-20 DIAGNOSIS — I5032 Chronic diastolic (congestive) heart failure: Secondary | ICD-10-CM

## 2013-04-20 NOTE — Assessment & Plan Note (Signed)
Holding sinus rhythm. 

## 2013-04-20 NOTE — Patient Instructions (Signed)
Your physician recommends that you continue on your current medications as directed. Please refer to the Current Medication list given to you today.  Your physician wants you to follow-up in: 1 year with Dr. Klein.  You will receive a reminder letter in the mail two months in advance. If you don't receive a letter, please call our office to schedule the follow-up appointment.  

## 2013-04-20 NOTE — Progress Notes (Signed)
Z. The face there is seen in a     Patient Care Team: Coralee Rud, PA-C as PCP - General (Cardiology)   HPI  [name] is a 79 y.o. male seen in followup for atrial fibrillation associated with congestive heart failure; he is tolerating Tikosyn well without known recurrences of atrial fibrillation.  Marland Kitchen  He underwent echocardiography and stress has been at [facility]. Echo showed LV function at 70%. Stress test showed ejection fraction 38% with inferior inferolateral inferobasilar ischemia. For clarification given the discordant information he underwent catheterization 11/13 demonstrating no obstructive disease with an ejection fraction of 50%  he had a recent echocardiogram which he said was going to we don't have the data. He also had blood work done in. We don't have the data  His exercise tolerance remains moderately limited. He has some edema. He sleeps with the mask. He states he takes too much medicine.     Past Medical History  Diagnosis Date  . Atrial fibrillation     on tikosyn  . Nonischemic cardiomyopathy     EF 20-30% apparently significantly improved  . HTN (hypertension)   . Hyperlipidemia   . Diabetes mellitus   . OSA (obstructive sleep apnea)     CPAP  . GERD (gastroesophageal reflux disease)   . Seasonal allergies   . Chronic constipation     Past Surgical History  Procedure Laterality Date  . Hernia repair      x3  . Prostate surgery      Current Outpatient Prescriptions  Medication Sig Dispense Refill  . Alcohol Swabs (ALCOHOL PREP) 70 % PADS As directed      . amLODipine (NORVASC) 2.5 MG tablet 2.5 mg daily.      Marland Kitchen atorvastatin (LIPITOR) 40 MG tablet Take 40 mg by mouth daily.        . carvedilol (COREG CR) 80 MG 24 hr capsule Take 1 capsule (80 mg total) by mouth daily.  90 capsule  3  . Cholecalciferol (VITAMIN D) 1000 UNITS capsule Take 1,000 Units by mouth 3 (three) times daily.        . dabigatran (PRADAXA) 150 MG CAPS Take 150 mg by  mouth 2 (two) times daily.        Marland Kitchen dofetilide (TIKOSYN) 500 MCG capsule Take 1 capsule (500 mcg total) by mouth 2 (two) times daily.  180 capsule  1  . FLUZONE HIGH-DOSE injection       . furosemide (LASIX) 40 MG tablet Take 40 mg by mouth daily.        Marland Kitchen glimepiride (AMARYL) 4 MG tablet Take 4 mg by mouth 2 (two) times daily.       Marland Kitchen LEVEMIR FLEXPEN 100 UNIT/ML injection Inject 70-80 Units into the skin 2 (two) times daily. 80am/70pm      . lisinopril (PRINIVIL,ZESTRIL) 40 MG tablet Take 1 tablet (40 mg total) by mouth daily.  90 tablet  3  . Magnesium 500 MG CAPS Take 2 capsules by mouth 2 (two) times daily.      . metFORMIN (GLUCOPHAGE) 1000 MG tablet Take 1,000 mg by mouth 2 (two) times daily with a meal.       . potassium chloride (K-DUR,KLOR-CON) 10 MEQ tablet Take 10 mEq by mouth daily.       No current facility-administered medications for this visit.    Allergies  Allergen Reactions  . Hyzaar [Losartan Potassium-Hctz]     Prolonged QTc  . Amlodipine  Edema  . Spironolactone Other (See Comments)    REACTION: gynecomastia  . Tetanus Toxoid Other (See Comments)    Reaction unknown    Review of Systems negative except from HPI and PMH  Physical Exam BP 158/88  Pulse 59  Ht 6\' 1"  (1.854 m)  Wt 272 lb 12.8 oz (123.741 kg)  BMI 36.00 kg/m2 Well developed  and morbidly obese male in no acute distress HENT normal E scleral and icterus clear Neck Supple JVP flat; carotids brisk and full Clear to ausculation  *Regular rate and rhythm, no murmurs gallops or rub Soft with active bowel sounds No clubbing cyanosis Trace Edema Alert and oriented, grossly normal motor and sensory function Skin Warm and Dry  ECG demonstrates sinus rhythm at 58 with PACs. Intervals 0.2/0.16/0.53 with a QTC of 5:30 iand corrected JT interval of less than 400  Assessment and  Plan

## 2013-04-26 ENCOUNTER — Ambulatory Visit: Payer: Medicare Other | Admitting: Internal Medicine

## 2013-08-08 ENCOUNTER — Other Ambulatory Visit: Payer: Self-pay | Admitting: Internal Medicine

## 2014-01-17 ENCOUNTER — Other Ambulatory Visit: Payer: Self-pay | Admitting: Internal Medicine

## 2014-04-19 ENCOUNTER — Encounter (HOSPITAL_COMMUNITY): Payer: Self-pay | Admitting: Cardiovascular Disease

## 2014-05-03 ENCOUNTER — Encounter: Payer: Self-pay | Admitting: Internal Medicine

## 2014-05-03 ENCOUNTER — Ambulatory Visit (INDEPENDENT_AMBULATORY_CARE_PROVIDER_SITE_OTHER): Payer: Medicare Other | Admitting: Internal Medicine

## 2014-05-03 VITALS — BP 118/80 | HR 79 | Ht 73.0 in | Wt 273.4 lb

## 2014-05-03 DIAGNOSIS — I4891 Unspecified atrial fibrillation: Secondary | ICD-10-CM

## 2014-05-03 NOTE — Patient Instructions (Signed)
Your physician recommends that you continue on your current medications as directed. Please refer to the Current Medication list given to you today.  Your physician wants you to follow-up in: 1 year with Dr. Klein.  You will receive a reminder letter in the mail two months in advance. If you don't receive a letter, please call our office to schedule the follow-up appointment.  

## 2014-05-03 NOTE — Progress Notes (Signed)
Z. The face there is seen in a     Patient Care Team: Coralee RudMichael R Duran, PA-C as PCP - General (Cardiology)   HPI  Shane HeadsBobby Anderson is a 80 y.o. male seen in followup for atrial fibrillation associated with congestive heart failure; he is tolerating Tikosyn well without known recurrences of atrial fibrillation.   Overall doing pretty well exercising regularly .  He underwent echocardiography and stress [facility] Echo showed LV function at 70%. Stress test showed ejection fraction 38% with inferior inferolateral inferobasilar ischemia. For clarification given the discordant information he underwent catheterization 11/13 demonstrating no obstructive disease with an ejection fraction of 50%  he had a recent echocardiogram which he said was going to we don't have the data.      Past Medical History  Diagnosis Date  . Atrial fibrillation     on tikosyn  . Nonischemic cardiomyopathy     EF 20-30% apparently significantly improved  . HTN (hypertension)   . Hyperlipidemia   . Diabetes mellitus   . OSA (obstructive sleep apnea)     CPAP  . GERD (gastroesophageal reflux disease)   . Seasonal allergies   . Chronic constipation     Past Surgical History  Procedure Laterality Date  . Hernia repair      x3  . Prostate surgery    . Left heart catheterization with coronary angiogram N/A 03/24/2012    Procedure: LEFT HEART CATHETERIZATION WITH CORONARY ANGIOGRAM;  Surgeon: Runell GessJonathan J Berry, MD;  [facility];  Service: Cardiovascular;  Laterality: N/A;    Current Outpatient Prescriptions  Medication Sig Dispense Refill  . Alcohol Swabs (ALCOHOL PREP) 70 % PADS As directed    . amLODipine (NORVASC) 2.5 MG tablet 2.5 mg daily.    Marland Kitchen. atorvastatin (LIPITOR) 40 MG tablet Take 40 mg by mouth daily. PT TAKE 20 MG DAILY    . carvedilol (COREG CR) 80 MG 24 hr capsule Take 1 capsule (80 mg total) by mouth daily. 90 capsule 3  . Cholecalciferol (VITAMIN D) 1000 UNITS capsule Take  1,000 Units by mouth 3 (three) times daily.      . dabigatran (PRADAXA) 150 MG CAPS Take 150 mg by mouth 2 (two) times daily.      . DUREZOL 0.05 % EMUL     . furosemide (LASIX) 40 MG tablet Take 40 mg by mouth daily.      Marland Kitchen. glimepiride (AMARYL) 4 MG tablet Take 4 mg by mouth 2 (two) times daily.     Marland Kitchen. LEVEMIR FLEXPEN 100 UNIT/ML injection Inject 70-80 Units into the skin 2 (two) times daily. 80am/70pm    . lisinopril (PRINIVIL,ZESTRIL) 40 MG tablet Take 1 tablet (40 mg total) by mouth daily. 90 tablet 3  . Magnesium 500 MG CAPS Take 2 capsules by mouth 2 (two) times daily.    . metFORMIN (GLUCOPHAGE) 1000 MG tablet Take 1,000 mg by mouth 2 (two) times daily with a meal.     . potassium chloride (K-DUR) 10 MEQ tablet     . potassium chloride (K-DUR,KLOR-CON) 10 MEQ tablet Take 10 mEq by mouth daily.    Marland Kitchen. TIKOSYN 500 MCG capsule Take 1 capsule by mouth  twice daily 180 capsule 0   No current facility-administered medications for this visit.    Allergies  Allergen Reactions  . Hyzaar [Losartan Potassium-Hctz]     Prolonged QTc  . Amlodipine     Edema  . Spironolactone Other (See Comments)  REACTION: gynecomastia  . Tetanus Toxoid Other (See Comments)    Reaction unknown    Review of Systems negative except from HPI and PMH  Physical Exam BP 118/80 mmHg  Pulse 79  Ht 6\' 1"  (1.854 m)  Wt 273 lb 6.4 oz (124.013 kg)  BMI 36.08 kg/m2  SpO2 94% Well developed  and morbidly obese male in no acute distress HENT normal E scleral and icterus clear Neck Supple JVP flat; carotids brisk and full Clear to ausculation  *Regular rate and rhythm, no murmurs gallops or rub Soft with active bowel sounds No clubbing cyanosis Trace Edema Alert and oriented, grossly normal motor and sensory function Skin Warm and Dry  ECG demonstrates sinus rhythm at 58 with PACs. Intervals 0.2/0.17/0.53 with a QTC of 560 iand corrected JT interval of less than 400  Assessment and   Plan  PAFib  NICM  Continue current meds  Overall stable

## 2014-08-06 ENCOUNTER — Other Ambulatory Visit: Payer: Self-pay | Admitting: Internal Medicine

## 2014-08-07 ENCOUNTER — Other Ambulatory Visit: Payer: Self-pay

## 2014-08-07 NOTE — Telephone Encounter (Signed)
Duke Salvia, MD at 05/03/2014 9:19 AM  TIKOSYN 500 MCG capsule Take 1 capsule by mouth twice daily Patient Instructions     Your physician recommends that you continue on your current medications as directed. Please refer to the Current Medication list given to you today.

## 2015-01-11 ENCOUNTER — Telehealth: Payer: Self-pay | Admitting: *Deleted

## 2015-01-11 NOTE — Telephone Encounter (Signed)
Received fax from [facility] about possbile interaction with patient's Tikosyn and abx (SMZ/TMP) Dr Vernell Barrier recently prescribed.  Reviewed with pharmacist, Kennon Rounds.  Called patient and advised him not to take this medication.  Patient informs me that he did take only a couple doses and then stopped.  He never finished medication. Was stopped almost a week ago.  Reviewed with pharmacist again who stated pt will not need EKG secondary to length of time since stopping abx.

## 2015-02-05 ENCOUNTER — Other Ambulatory Visit: Payer: Self-pay

## 2015-02-05 NOTE — Telephone Encounter (Signed)
Medication Detail      Disp Refills Start End     TIKOSYN 500 MCG capsule 180 capsule 2 08/07/2014     Sig: Take 1 capsule by mouth twice daily    E-Prescribing Status: Receipt confirmed by pharmacy (08/07/2014 10:54 AM EDT)     Pharmacy    [address] SERVICE - [place], [place] - [address]

## 2015-02-06 ENCOUNTER — Other Ambulatory Visit: Payer: Self-pay | Admitting: *Deleted

## 2015-02-06 MED ORDER — DOFETILIDE 500 MCG PO CAPS: ORAL_CAPSULE | ORAL | Status: DC

## 2015-03-27 ENCOUNTER — Other Ambulatory Visit: Payer: Self-pay | Admitting: Internal Medicine

## 2015-06-06 ENCOUNTER — Other Ambulatory Visit: Payer: Self-pay | Admitting: Internal Medicine

## 2015-07-08 ENCOUNTER — Other Ambulatory Visit: Payer: Self-pay | Admitting: *Deleted

## 2015-07-08 MED ORDER — DOFETILIDE 500 MCG PO CAPS: ORAL_CAPSULE | ORAL | Status: DC

## 2015-08-05 ENCOUNTER — Telehealth: Payer: Self-pay | Admitting: Internal Medicine

## 2015-08-05 ENCOUNTER — Other Ambulatory Visit: Payer: Self-pay | Admitting: *Deleted

## 2015-08-05 MED ORDER — DOFETILIDE 500 MCG PO CAPS: ORAL_CAPSULE | ORAL | Status: DC

## 2015-08-05 NOTE — Telephone Encounter (Signed)
Pt needs refill of Tikosyn to [facility]  for a 90 day supply

## 2015-08-26 ENCOUNTER — Encounter: Payer: Self-pay | Admitting: Internal Medicine

## 2015-08-26 ENCOUNTER — Ambulatory Visit (INDEPENDENT_AMBULATORY_CARE_PROVIDER_SITE_OTHER): Payer: Self-pay | Admitting: Internal Medicine

## 2015-08-26 VITALS — BP 144/80 | HR 72 | Ht 73.0 in | Wt 255.2 lb

## 2015-08-26 DIAGNOSIS — I428 Other cardiomyopathies: Secondary | ICD-10-CM

## 2015-08-26 DIAGNOSIS — I429 Cardiomyopathy, unspecified: Secondary | ICD-10-CM

## 2015-08-26 DIAGNOSIS — I4891 Unspecified atrial fibrillation: Secondary | ICD-10-CM

## 2015-08-26 NOTE — Progress Notes (Signed)
Z. The face there is seen in a     Patient Care Team: Coralee Rud, PA-C as PCP - General (Cardiology)   HPI  [name] is a 82 y.o. male seen in followup for atrial fibrillation associated with congestive heart failure; he is tolerating Tikosyn well without known recurrences of atrial fibrillation.   Overall doing pretty well exercising regularly .  He underwent echocardiography and stress has been at [facility]. Echo showed LV function at 70%. Stress test showed ejection fraction 38% with inferior inferolateral inferobasilar ischemia. For clarification given the discordant information he underwent catheterization 11/13 demonstrating no obstructive disease with an ejection fraction of 50%  he had a recent echocardiogram which he said was going to we don't have the data.     He apparently underwent repeat testing 3/17; these results have not been reviewed yet with the patient and we do not have access to them. He also had blood work done; his potassium was apparently normal. He has had no interval atrial fibrillation of which he is aware.  He is increasingly less active. He has dyspnea with pending. He has dyspnea on exertion. He has mild peripheral edema. He denies chest pain.  He notes that he has been found to not have sleep apnea any longer. THis may be associated with his 20 pound weight loss  Past Medical History  Diagnosis Date  . Atrial fibrillation (HCC)     on tikosyn  . Nonischemic cardiomyopathy (HCC)     EF 20-30% apparently significantly improved  . HTN (hypertension)   . Hyperlipidemia   . Diabetes mellitus   . OSA (obstructive sleep apnea)     CPAP  . GERD (gastroesophageal reflux disease)   . Seasonal allergies   . Chronic constipation     Past Surgical History  Procedure Laterality Date  . Hernia repair      x3  . Prostate surgery    . Left heart catheterization with coronary angiogram N/A 03/24/2012    Procedure: LEFT HEART CATHETERIZATION WITH  CORONARY ANGIOGRAM;  Surgeon: Runell Gess, MD;  Location: [facility] CATH LAB;  Service: Cardiovascular;  Laterality: N/A;    Current Outpatient Prescriptions  Medication Sig Dispense Refill  . Alcohol Swabs (ALCOHOL PREP) 70 % PADS As directed    . amLODipine (NORVASC) 2.5 MG tablet 2.5 mg daily.    Marland Kitchen atorvastatin (LIPITOR) 40 MG tablet Take 20 mg by mouth daily.     . carvedilol (COREG CR) 80 MG 24 hr capsule Take 1 capsule (80 mg total) by mouth daily. 90 capsule 3  . Cholecalciferol (VITAMIN D) 1000 UNITS capsule Take 1,000 Units by mouth 3 (three) times daily.      . dabigatran (PRADAXA) 150 MG CAPS Take 150 mg by mouth 2 (two) times daily.      Marland Kitchen dofetilide (TIKOSYN) 500 MCG capsule Take 1 capsule by mouth  twice daily 180 capsule 0  . ELIQUIS 5 MG TABS tablet Take 5 mg by mouth 2 (two) times daily.     . furosemide (LASIX) 40 MG tablet Take 40 mg by mouth daily.      Marland Kitchen glimepiride (AMARYL) 4 MG tablet Take 4 mg by mouth 2 (two) times daily.     Marland Kitchen LEVEMIR FLEXPEN 100 UNIT/ML injection Inject 70-80 Units into the skin 2 (two) times daily. 80am/70pm    . lisinopril (PRINIVIL,ZESTRIL) 40 MG tablet Take 1 tablet (40 mg total) by mouth daily. 90 tablet 3  . Magnesium 500  MG CAPS Take 2 capsules by mouth 2 (two) times daily.    . metFORMIN (GLUCOPHAGE) 1000 MG tablet Take 1,000 mg by mouth 2 (two) times daily with a meal.     . potassium chloride (K-DUR,KLOR-CON) 10 MEQ tablet Take 10 mEq by mouth daily.     No current facility-administered medications for this visit.    Allergies  Allergen Reactions  . Hyzaar [Losartan Potassium-Hctz] Other (See Comments)    Prolonged QTc Prolonged QTc  . Amlodipine     Edema  . Spironolactone Other (See Comments)    REACTION: gynecomastia  . Tetanus Toxoid Other (See Comments)    Reaction unknown    Review of Systems negative except from HPI and PMH  Physical Exam BP 144/80 mmHg  Pulse 72  Ht 6\' 1"  (1.854 m)  Wt 255 lb 3.2 oz (115.758 kg)   BMI 33.68 kg/m2 Well developed  and morbidly obese male in no acute distress HENT normal E scleral and icterus clear Neck Supple JVP flat; carotids brisk and full Clear to ausculation  *Regular rate and rhythm, no murmurs gallops or rub Soft with active bowel sounds No clubbing cyanosis Trace Edema Alert and oriented, grossly normal motor and sensory function Skin Warm and Dry  ECG demonstrates sinus rhythm at 58 with PACs. Intervals 0.20/ 17/0.53 with a QTC of 560 iand corrected JT interval of less than 400 RBBB  Assessment and  Plan  PAFib  NICM  Morbidly obese  DOE  Continue current meds  Overall stable  We will get his dofetilide laboratories. Management of his exercise intolerance is the care of his primary cardiology team. \ Based on the Accord trial blood pressure is reasonable at 140

## 2015-08-26 NOTE — Patient Instructions (Signed)

## 2015-09-04 ENCOUNTER — Encounter: Payer: Self-pay | Admitting: Internal Medicine

## 2015-09-23 ENCOUNTER — Other Ambulatory Visit: Payer: Self-pay | Admitting: Internal Medicine

## 2016-09-24 ENCOUNTER — Telehealth: Payer: Self-pay | Admitting: Internal Medicine

## 2016-09-24 NOTE — Telephone Encounter (Signed)
New Message  Pt voiced wanting to speak with nurse in regards to needing a referral to see another MD due to him moving almost 3hrs away.  Please f/u

## 2016-09-24 NOTE — Telephone Encounter (Signed)
I called and spoke with the patient. [name] states [name] is moving on 10/03/16 to [place], [place]. [name] has already made an appointment with Dr. Graciela Husbands for July- [name] will keep that appointment and discuss his move at that time and possible MD's to see in the area to which [name] is moving.

## 2016-10-16 ENCOUNTER — Ambulatory Visit: Payer: Self-pay | Admitting: Internal Medicine

## 2016-10-26 ENCOUNTER — Other Ambulatory Visit: Payer: Self-pay | Admitting: Internal Medicine

## 2016-12-02 ENCOUNTER — Encounter: Payer: Self-pay | Admitting: Internal Medicine

## 2016-12-02 ENCOUNTER — Ambulatory Visit (INDEPENDENT_AMBULATORY_CARE_PROVIDER_SITE_OTHER): Payer: Medicare Other | Admitting: Internal Medicine

## 2016-12-02 VITALS — BP 124/66 | HR 62 | Ht 73.0 in | Wt 240.0 lb

## 2016-12-02 DIAGNOSIS — I428 Other cardiomyopathies: Secondary | ICD-10-CM | POA: Diagnosis not present

## 2016-12-02 DIAGNOSIS — Z79899 Other long term (current) drug therapy: Secondary | ICD-10-CM

## 2016-12-02 DIAGNOSIS — I480 Paroxysmal atrial fibrillation: Secondary | ICD-10-CM | POA: Diagnosis not present

## 2016-12-02 NOTE — Patient Instructions (Signed)
Medication Instructions: Your physician has recommended you make the following change in your medication:  1) stop norvasc (amlodipine)  Labwork: - Your physician recommends that you have lab work today: BMP/ CBC/ Magnesium   Procedures/Testing: - none ordered  Follow-Up: - Dr. Graciela Husbands will see you back on an as needed basis.   Any Additional Special Instructions Will Be Listed Below (If Applicable). - Dr. Weldon Inches [facility]) - Dr. Prentice Docker [facility])    If you need a refill on your cardiac medications before your next appointment, please call your pharmacy.

## 2016-12-02 NOTE — Progress Notes (Signed)
Patient Care Team: Ronnald Collum as PCP - General (Cardiology)   HPI  [name] is a 83 y.o. male seen in followup for atrial fibrillation associated with congestive heart failure; he is tolerating Tikosyn well without known recurrences of atrial fibrillation.   Overall doing pretty well exercising regularly .  He underwent echocardiography and stress has been at [facility]. Echo showed LV function at 70%. Stress test showed ejection fraction 38% with inferior inferolateral inferobasilar ischemia. For clarification given the discordant information he underwent catheterization 11/13 demonstrating no obstructive disease with an ejection fraction of 50%  he had a recent echocardiogram which he said was going to we don't have the data.      He has now moved to [place]. His wife's family is from there she is thrilled. He is ambivalent. He has lost 40 pounds. Exercise tolerance is significantly improved. He is doing of the stop his diuretic.  No chest pain. Mild peripheral edema. No syncope. No palpitations of which  he is aware.   Date Cr Hgb  4/17 0.9 14.9          Past Medical History:  Diagnosis Date  . Atrial fibrillation (HCC)    on tikosyn  . Chronic constipation   . Diabetes mellitus   . GERD (gastroesophageal reflux disease)   . HTN (hypertension)   . Hyperlipidemia   . Nonischemic cardiomyopathy (HCC)    EF 20-30% apparently significantly improved  . OSA (obstructive sleep apnea)    CPAP  . Seasonal allergies     Past Surgical History:  Procedure Laterality Date  . HERNIA REPAIR     x3  . LEFT HEART CATHETERIZATION WITH CORONARY ANGIOGRAM N/A 03/24/2012   Procedure: LEFT HEART CATHETERIZATION WITH CORONARY ANGIOGRAM;  Surgeon: Runell Gess, MD;  Location: [facility] CATH LAB;  Service: Cardiovascular;  Laterality: N/A;  . PROSTATE SURGERY      Current Outpatient Prescriptions  Medication Sig Dispense Refill  . Alcohol Swabs (ALCOHOL PREP) 70  % PADS As directed    . amLODipine (NORVASC) 2.5 MG tablet 2.5 mg daily.    Marland Kitchen atorvastatin (LIPITOR) 40 MG tablet Take 20 mg by mouth daily.     . carvedilol (COREG CR) 40 MG 24 hr capsule Take 40 mg by mouth daily.    . Cholecalciferol (VITAMIN D) 1000 UNITS capsule Take 1,000 Units by mouth 3 (three) times daily.      Marland Kitchen dofetilide (TIKOSYN) 500 MCG capsule TAKE 1 CAPSULE BY MOUTH  TWICE DAILY 180 capsule 0  . ELIQUIS 5 MG TABS tablet Take 5 mg by mouth 2 (two) times daily.     Marland Kitchen glimepiride (AMARYL) 4 MG tablet Take 4 mg by mouth 2 (two) times daily.     Marland Kitchen LEVEMIR FLEXPEN 100 UNIT/ML injection Inject 70-80 Units into the skin 2 (two) times daily. 80am/70pm    . lisinopril (PRINIVIL,ZESTRIL) 40 MG tablet Take 1 tablet (40 mg total) by mouth daily. 90 tablet 3  . Magnesium 500 MG CAPS Take 2 capsules by mouth 2 (two) times daily.    . metFORMIN (GLUCOPHAGE) 1000 MG tablet Take 1,000 mg by mouth 2 (two) times daily with a meal.     . potassium chloride (K-DUR,KLOR-CON) 10 MEQ tablet Take 10 mEq by mouth daily.    . furosemide (LASIX) 40 MG tablet Take 40 mg by mouth daily.       No current facility-administered medications for this visit.  Allergies  Allergen Reactions  . Hyzaar [Losartan Potassium-Hctz] Other (See Comments)    Prolonged QTc Prolonged QTc  . Amlodipine     Edema  . Spironolactone Other (See Comments)    REACTION: gynecomastia  . Tetanus Toxoid Other (See Comments)    Reaction unknown    Review of Systems negative except from HPI and PMH  Physical Exam BP 124/66   Pulse 62   Ht 6\' 1"  (1.854 m)   Wt 240 lb (108.9 kg)   SpO2 96%   BMI 31.66 kg/m  Well developed and nourished in no acute distress HENT normal Neck supple with JVP-flat Carotids brisk and full without bruits Clear Regular rate and rhythm, no murmurs or gallops Abd-soft with active BS without hepatomegaly No Clubbing cyanosis edema Skin-warm and dry A & Oriented  Grossly normal sensory and  motor function  ECG Sinus with freq PACs 70 21/18/51 RBBB  Assessment and  Plan  PAFib  NICM  Morbidly obese   He has lost 40 pounds and feels much better  Insulin requirements are down. He is no longer taking a diuretic.  Less dyspnea on exertion; have encouraged him to continue to work on losing weight.  No interval atrial fibrillation of which he is aware. There are some irregularities. Continue dofetilide. We will check surveillance laboratories. QTc is okay in the context of QRS prolongation  On Anticoagulation;  No bleeding issues

## 2016-12-03 LAB — BASIC METABOLIC PANEL
BUN / CREAT RATIO: 23 (ref 10–24)
BUN: 14 mg/dL (ref 8–27)
CHLORIDE: 99 mmol/L (ref 96–106)
CO2: 20 mmol/L (ref 20–29)
Calcium: 8.9 mg/dL (ref 8.6–10.2)
Creatinine, Ser: 0.62 mg/dL — ABNORMAL LOW (ref 0.76–1.27)
GFR calc Af Amer: 107 mL/min/{1.73_m2} (ref >59)
GFR calc non Af Amer: 92 mL/min/{1.73_m2} (ref >59)
Glucose: 110 mg/dL — ABNORMAL HIGH (ref 65–99)
Potassium: 3.6 mmol/L (ref 3.5–5.2)
Sodium: 139 mmol/L (ref 134–144)

## 2016-12-03 LAB — CBC WITH DIFFERENTIAL/PLATELET
Basophils Absolute: 0 10*3/uL (ref 0.0–0.2)
Basos: 0 %
EOS (ABSOLUTE): 0.1 10*3/uL (ref 0.0–0.4)
EOS: 2 %
HEMATOCRIT: 42.7 % (ref 37.5–51.0)
HEMOGLOBIN: 14.2 g/dL (ref 13.0–17.7)
Immature Grans (Abs): 0 10*3/uL (ref 0.0–0.1)
Immature Granulocytes: 0 %
LYMPHS ABS: 1.8 10*3/uL (ref 0.7–3.1)
Lymphs: 28 %
MCH: 29.8 pg (ref 26.6–33.0)
MCHC: 33.3 g/dL (ref 31.5–35.7)
MCV: 90 fL (ref 79–97)
MONOCYTES: 8 %
Monocytes Absolute: 0.5 10*3/uL (ref 0.1–0.9)
NEUTROS ABS: 4 10*3/uL (ref 1.4–7.0)
Neutrophils: 62 %
Platelets: 226 10*3/uL (ref 150–379)
RBC: 4.77 x10E6/uL (ref 4.14–5.80)
RDW: 14 % (ref 12.3–15.4)
WBC: 6.4 10*3/uL (ref 3.4–10.8)

## 2016-12-03 LAB — MAGNESIUM: Magnesium: 1.7 mg/dL (ref 1.6–2.3)

## 2016-12-21 ENCOUNTER — Telehealth: Payer: Self-pay | Admitting: Internal Medicine

## 2016-12-21 ENCOUNTER — Telehealth: Payer: Self-pay

## 2016-12-21 NOTE — Telephone Encounter (Signed)
Pt is aware and agreeable to normal labs 

## 2016-12-21 NOTE — Telephone Encounter (Signed)
New message    Pt wife is calling stating that Dr. Graciela Husbands referred pt to Dr. Ouida Sills in [place] because they moved. She is calling asking for a referral. She gave phone number [phone] fax [phone] for Dr. Audrie Lia.

## 2016-12-22 ENCOUNTER — Telehealth: Payer: Self-pay | Admitting: Internal Medicine

## 2016-12-22 NOTE — Telephone Encounter (Signed)
Patient has moved to [place]- patient asked for recommendation for a provider for him to see in that area.  Dr. Graciela Husbands recommended Dr. Audrie Lia.  I spoke with the patient's wife today to inquire what they were needing as we technically aren't referring him there. They are requesting medical records be sent in order to establish care.  I advised I would forward to Medical records to assist with this and the patient's wife was made aware if a signed release was needed, then our medical records department would be getting in touch.  She voices understanding.

## 2016-12-22 NOTE — Telephone Encounter (Signed)
Patient aware ROI needs to be singed. Mailed to  [address] [place] [address]

## 2016-12-31 ENCOUNTER — Other Ambulatory Visit: Payer: Self-pay | Admitting: Internal Medicine

## 2017-12-27 ENCOUNTER — Other Ambulatory Visit: Payer: Self-pay | Admitting: Internal Medicine

## 2017-12-27 NOTE — Telephone Encounter (Signed)
Pt last seen on 12/02/16 but was giving PRN F/U with Dr. Graciela Husbands. Please advice on refill request.

## 2024-05-11 DEATH — deceased
